# Patient Record
Sex: Male | Born: 1965 | Race: Black or African American | Hispanic: No | Marital: Married | Smoking: Never smoker
Health system: Southern US, Community
[De-identification: ages and names within clinical notes are randomized; demographics above are authoritative.]

## PROBLEM LIST (undated history)

## (undated) HISTORY — PX: OTHER SURGICAL HISTORY: SHX169

## (undated) HISTORY — PX: KNEE ARTHROSCOPY: SUR90

---

## 2003-11-09 ENCOUNTER — Emergency Department: Payer: Self-pay | Admitting: Internal Medicine

## 2013-12-26 ENCOUNTER — Emergency Department: Payer: Self-pay | Admitting: Emergency Medicine

## 2014-01-09 ENCOUNTER — Emergency Department: Payer: Self-pay | Admitting: Emergency Medicine

## 2014-01-09 LAB — CBC
HCT: 42.3 % (ref 40.0–52.0)
HGB: 13.9 g/dL (ref 13.0–18.0)
MCH: 29.8 pg (ref 26.0–34.0)
MCHC: 32.9 g/dL (ref 32.0–36.0)
MCV: 91 fL (ref 80–100)
Platelet: 218 10*3/uL (ref 150–440)
RBC: 4.68 10*6/uL (ref 4.40–5.90)
RDW: 13.9 % (ref 11.5–14.5)
WBC: 6.1 10*3/uL (ref 3.8–10.6)

## 2014-01-09 LAB — BASIC METABOLIC PANEL
ANION GAP: 6 — AB (ref 7–16)
BUN: 10 mg/dL (ref 7–18)
CHLORIDE: 103 mmol/L (ref 98–107)
Calcium, Total: 7.9 mg/dL — ABNORMAL LOW (ref 8.5–10.1)
Co2: 28 mmol/L (ref 21–32)
Creatinine: 0.92 mg/dL (ref 0.60–1.30)
EGFR (African American): >60
Glucose: 82 mg/dL (ref 65–99)
Osmolality: 272 (ref 275–301)
POTASSIUM: 4.1 mmol/L (ref 3.5–5.1)
Sodium: 137 mmol/L (ref 136–145)

## 2014-01-09 LAB — TROPONIN I: Troponin-I: 0.03 ng/mL

## 2014-04-25 ENCOUNTER — Emergency Department: Payer: Self-pay | Admitting: Internal Medicine

## 2015-04-10 ENCOUNTER — Inpatient Hospital Stay
Admission: EM | Admit: 2015-04-10 | Discharge: 2015-04-18 | DRG: 871 | Disposition: A | Discharge status: Home / Self Care | Payer: No Typology Code available for payment source | Attending: Internal Medicine | Admitting: Internal Medicine

## 2015-04-10 ENCOUNTER — Emergency Department: Payer: No Typology Code available for payment source

## 2015-04-10 DIAGNOSIS — I309 Acute pericarditis, unspecified: Secondary | ICD-10-CM | POA: Diagnosis present

## 2015-04-10 DIAGNOSIS — R197 Diarrhea, unspecified: Secondary | ICD-10-CM | POA: Diagnosis present

## 2015-04-10 DIAGNOSIS — I248 Other forms of acute ischemic heart disease: Secondary | ICD-10-CM | POA: Diagnosis present

## 2015-04-10 DIAGNOSIS — J918 Pleural effusion in other conditions classified elsewhere: Secondary | ICD-10-CM | POA: Diagnosis present

## 2015-04-10 DIAGNOSIS — J9601 Acute respiratory failure with hypoxia: Secondary | ICD-10-CM

## 2015-04-10 DIAGNOSIS — J90 Pleural effusion, not elsewhere classified: Secondary | ICD-10-CM

## 2015-04-10 DIAGNOSIS — J1100 Influenza due to unidentified influenza virus with unspecified type of pneumonia: Secondary | ICD-10-CM | POA: Diagnosis present

## 2015-04-10 DIAGNOSIS — R778 Other specified abnormalities of plasma proteins: Secondary | ICD-10-CM

## 2015-04-10 DIAGNOSIS — A4189 Other specified sepsis: Secondary | ICD-10-CM | POA: Diagnosis present

## 2015-04-10 DIAGNOSIS — R7989 Other specified abnormal findings of blood chemistry: Secondary | ICD-10-CM

## 2015-04-10 DIAGNOSIS — E876 Hypokalemia: Secondary | ICD-10-CM

## 2015-04-10 DIAGNOSIS — M3500 Sicca syndrome, unspecified: Secondary | ICD-10-CM | POA: Diagnosis not present

## 2015-04-10 DIAGNOSIS — I32 Pericarditis in diseases classified elsewhere: Secondary | ICD-10-CM | POA: Diagnosis not present

## 2015-04-10 DIAGNOSIS — J849 Interstitial pulmonary disease, unspecified: Secondary | ICD-10-CM | POA: Diagnosis present

## 2015-04-10 DIAGNOSIS — E871 Hypo-osmolality and hyponatremia: Secondary | ICD-10-CM

## 2015-04-10 DIAGNOSIS — I444 Left anterior fascicular block: Secondary | ICD-10-CM | POA: Diagnosis present

## 2015-04-10 DIAGNOSIS — J189 Pneumonia, unspecified organism: Secondary | ICD-10-CM | POA: Diagnosis present

## 2015-04-10 DIAGNOSIS — R0602 Shortness of breath: Secondary | ICD-10-CM

## 2015-04-10 LAB — TROPONIN I
TROPONIN I: 0.06 ng/mL — AB (ref <0.031)
TROPONIN I: 0.16 ng/mL — AB (ref <0.031)
TROPONIN I: 0.09 ng/mL — AB (ref <0.031)
Troponin I: 0.07 ng/mL — ABNORMAL HIGH (ref <0.031)
Troponin I: 0.1 ng/mL — ABNORMAL HIGH (ref <0.031)

## 2015-04-10 LAB — COMPREHENSIVE METABOLIC PANEL
ALT: 35 U/L (ref 17–63)
AST: 59 U/L — ABNORMAL HIGH (ref 15–41)
Albumin: 3.5 g/dL (ref 3.5–5.0)
Alkaline Phosphatase: 53 U/L (ref 38–126)
Anion gap: 10 (ref 5–15)
BUN: 13 mg/dL (ref 6–20)
CHLORIDE: 98 mmol/L — AB (ref 101–111)
CO2: 25 mmol/L (ref 22–32)
CREATININE: 0.96 mg/dL (ref 0.61–1.24)
Calcium: 8.5 mg/dL — ABNORMAL LOW (ref 8.9–10.3)
GFR calc non Af Amer: >60 mL/min (ref >60)
Glucose, Bld: 119 mg/dL — ABNORMAL HIGH (ref 65–99)
POTASSIUM: 3.7 mmol/L (ref 3.5–5.1)
SODIUM: 133 mmol/L — AB (ref 135–145)
Total Bilirubin: 0.8 mg/dL (ref 0.3–1.2)
Total Protein: 8.1 g/dL (ref 6.5–8.1)

## 2015-04-10 LAB — CBC
HCT: 41.6 % (ref 40.0–52.0)
Hemoglobin: 14.2 g/dL (ref 13.0–18.0)
MCH: 30.5 pg (ref 26.0–34.0)
MCHC: 34.1 g/dL (ref 32.0–36.0)
MCV: 89.4 fL (ref 80.0–100.0)
PLATELETS: 158 10*3/uL (ref 150–440)
RBC: 4.65 MIL/uL (ref 4.40–5.90)
RDW: 13.9 % (ref 11.5–14.5)
WBC: 9.3 10*3/uL (ref 3.8–10.6)

## 2015-04-10 LAB — RAPID INFLUENZA A&B ANTIGENS (ARMC ONLY)
INFLUENZA A (ARMC): NEGATIVE
INFLUENZA B (ARMC): NEGATIVE

## 2015-04-10 LAB — INFLUENZA PANEL BY PCR (TYPE A & B)
H1N1FLUPCR: NOT DETECTED
INFLBPCR: NEGATIVE
Influenza A By PCR: POSITIVE — AB

## 2015-04-10 MED ORDER — SODIUM CHLORIDE 0.9 % IV SOLN
INTRAVENOUS | Status: AC
  Administered 2015-04-10: 10:00:00 via INTRAVENOUS

## 2015-04-10 MED ORDER — ACETAMINOPHEN 325 MG PO TABS
650.0000 mg | ORAL_TABLET | Freq: Four times a day (QID) | ORAL | Status: DC | PRN
  Administered 2015-04-12: 650 mg via ORAL
  Filled 2015-04-10: qty 2

## 2015-04-10 MED ORDER — SODIUM CHLORIDE 0.9 % IV BOLUS (SEPSIS)
1000.0000 mL | Freq: Once | INTRAVENOUS | Status: AC
Start: 2015-04-10 — End: 2015-04-10
  Administered 2015-04-10: 1000 mL via INTRAVENOUS

## 2015-04-10 MED ORDER — LEVOFLOXACIN IN D5W 750 MG/150ML IV SOLN
750.0000 mg | Freq: Once | INTRAVENOUS | Status: AC
  Administered 2015-04-10: 750 mg via INTRAVENOUS
  Filled 2015-04-10: qty 150

## 2015-04-10 MED ORDER — SODIUM CHLORIDE 0.9% FLUSH
3.0000 mL | Freq: Two times a day (BID) | INTRAVENOUS | Status: DC
  Administered 2015-04-10 – 2015-04-18 (×9): 3 mL via INTRAVENOUS

## 2015-04-10 MED ORDER — OSELTAMIVIR PHOSPHATE 75 MG PO CAPS
75.0000 mg | ORAL_CAPSULE | Freq: Two times a day (BID) | ORAL | Status: AC
  Administered 2015-04-10 – 2015-04-14 (×10): 75 mg via ORAL
  Filled 2015-04-10 (×10): qty 1

## 2015-04-10 MED ORDER — SODIUM CHLORIDE 0.9 % IV SOLN: 250.0000 mL | INTRAVENOUS | Status: DC | PRN

## 2015-04-10 MED ORDER — ENOXAPARIN SODIUM 40 MG/0.4ML ~~LOC~~ SOLN
40.0000 mg | SUBCUTANEOUS | Status: DC
  Administered 2015-04-12 – 2015-04-17 (×4): 40 mg via SUBCUTANEOUS
  Filled 2015-04-10 (×8): qty 0.4

## 2015-04-10 MED ORDER — IPRATROPIUM-ALBUTEROL 0.5-2.5 (3) MG/3ML IN SOLN
3.0000 mL | RESPIRATORY_TRACT | Status: DC | PRN
  Administered 2015-04-10: 3 mL via RESPIRATORY_TRACT
  Filled 2015-04-10 (×2): qty 3

## 2015-04-10 MED ORDER — OXYCODONE-ACETAMINOPHEN 5-325 MG PO TABS
1.0000 | ORAL_TABLET | Freq: Four times a day (QID) | ORAL | Status: DC | PRN
  Administered 2015-04-10 – 2015-04-18 (×27): 1 via ORAL
  Filled 2015-04-10 (×28): qty 1

## 2015-04-10 MED ORDER — AZITHROMYCIN 500 MG IV SOLR
500.0000 mg | INTRAVENOUS | Status: DC
  Administered 2015-04-11 – 2015-04-12 (×2): 500 mg via INTRAVENOUS
  Filled 2015-04-10 (×2): qty 500

## 2015-04-10 MED ORDER — DEXTROSE 5 % IV SOLN
1.0000 g | INTRAVENOUS | Status: AC
  Administered 2015-04-10 – 2015-04-16 (×7): 1 g via INTRAVENOUS
  Filled 2015-04-10 (×7): qty 10

## 2015-04-10 MED ORDER — ACETAMINOPHEN 500 MG PO TABS
1000.0000 mg | ORAL_TABLET | Freq: Once | ORAL | Status: AC
  Administered 2015-04-10: 1000 mg via ORAL
  Filled 2015-04-10: qty 2

## 2015-04-10 MED ORDER — GUAIFENESIN 100 MG/5ML PO SOLN
5.0000 mL | ORAL | Status: DC | PRN
  Administered 2015-04-17: 100 mg via ORAL
  Filled 2015-04-10: qty 10

## 2015-04-10 MED ORDER — ATORVASTATIN CALCIUM 20 MG PO TABS
40.0000 mg | ORAL_TABLET | Freq: Every day | ORAL | Status: DC
  Administered 2015-04-12 – 2015-04-17 (×3): 40 mg via ORAL
  Filled 2015-04-10 (×4): qty 2

## 2015-04-10 MED ORDER — SODIUM CHLORIDE 0.9% FLUSH: 3.0000 mL | INTRAVENOUS | Status: DC | PRN

## 2015-04-10 MED ORDER — ASPIRIN EC 325 MG PO TBEC
325.0000 mg | DELAYED_RELEASE_TABLET | Freq: Every day | ORAL | Status: DC
  Administered 2015-04-10 – 2015-04-18 (×9): 325 mg via ORAL
  Filled 2015-04-10 (×9): qty 1

## 2015-04-10 MED ORDER — OXYCODONE-ACETAMINOPHEN 5-325 MG PO TABS
1.0000 | ORAL_TABLET | Freq: Once | ORAL | Status: AC
  Administered 2015-04-10: 1 via ORAL
  Filled 2015-04-10: qty 1

## 2015-04-10 MED ORDER — IBUPROFEN 400 MG PO TABS
400.0000 mg | ORAL_TABLET | Freq: Four times a day (QID) | ORAL | Status: DC | PRN
  Administered 2015-04-10 – 2015-04-13 (×6): 400 mg via ORAL
  Filled 2015-04-10 (×6): qty 1

## 2015-04-10 NOTE — Progress Notes (Signed)
Pts Influenza A by  PCR , positive. MD Imogene Burnhen notified. MD placing orders.

## 2015-04-10 NOTE — ED Provider Notes (Signed)
[facility] Emergency Department Provider Note  Time seen: 6:35 AM  I have reviewed the triage vital signs and the nursing notes.   HISTORY  Chief Complaint Generalized Body Aches; Diarrhea; and Fever    HPI Ethan Warner is a 50 y.o. male with no past medical history who presents the emergency department with several days of bodyaches fever, nausea, vomiting, diarrhea, cough congestion and decreased urination. According to the patient for the past 3 days or so he has been coughing, at first with body aches, nausea, vomiting, diarrhea although he states the vomiting and diarrhea have largely resolved at this point. Patient also with fever to 103 in the emergency department. Of note the patient states he has not been eating or drinking much but also states he has not urinated in approximately 2 days which is very abnormal for him. Denies any abdominal pain.Does state mild chest discomfort when coughing.     History reviewed. No pertinent past medical history.  There are no active problems to display for this patient.   History reviewed. No pertinent past surgical history.  No current outpatient prescriptions on file.  Allergies Review of patient's allergies indicates no known allergies.  No family history on file.  Social History Social History  Substance Use Topics  . Smoking status: Never Smoker   . Smokeless tobacco: None  . Alcohol Use: Yes    Review of Systems Constitutional: Negative for fever. Cardiovascular: Mild chest pain when coughing Respiratory: Negative for shortness of breath. Positive for cough. Gastrointestinal: Negative for abdominal pain. Positive for nausea, vomiting, diarrhea although patient states these have largely resolved. Genitourinary: Decreased urination Musculoskeletal: Negative for back pain. Neurological: Negative for headache  10-point ROS otherwise  negative.  ____________________________________________   PHYSICAL EXAM:  VITAL SIGNS: ED Triage Vitals  Enc Vitals Group     BP 04/10/15 0624 117/69 mmHg     Pulse Rate 04/10/15 0624 104     Resp 04/10/15 0624 20     Temp 04/10/15 0624 103 F (39.4 C)     Temp Source 04/10/15 0624 Oral     SpO2 04/10/15 0624 95 %     Weight 04/10/15 0624 198 lb (89.812 kg)     Height 04/10/15 0624  (1.753 m)     Head Cir --      Peak Flow --      Pain Score 04/10/15 0625 10     Pain Loc --      Pain Edu? --      Excl. in GC? --     Constitutional: Alert and oriented. Well appearing and in no distress. Eyes: Normal exam ENT   Head: Normocephalic and atraumatic.   Mouth/Throat: Mucous membranes are moist. Cardiovascular: Normal rate, regular rhythm around 100 bpm. No murmur Respiratory: Normal respiratory effort without tachypnea nor retractions. Breath sounds are clear Gastrointestinal: Soft and nontender. No distention. Musculoskeletal: Nontender with normal range of motion in all extremities.  Neurologic:  Normal speech and language. No gross focal neurologic deficits  Skin:  Skin is warm, dry and intact.  Psychiatric: Mood and affect are normal. Speech and behavior are normal.  ____________________________________________    EKG  EKG reviewed and interpreted by myself shows sinus tachycardia 106 bpm, narrow QRS, normal axis, normal intervals, RSR pattern most consistent with right bundle branch block.  ____________________________________________    RADIOLOGY  Multifocal pneumonia  ____________________________________________   INITIAL IMPRESSION / ASSESSMENT AND PLAN / ED COURSE  Pertinent  labs & imaging results that were available during my care of the patient were reviewed by me and considered in my medical decision making (see chart for details).  Patient with body aches, fever, nausea, vomiting, diarrhea, cough and congestion. States mild chest  discomfort but only when coughing. Patient has a fever 103 in the emergency department. Of note the patient states he has not urinated in approximately 2 days. Denies any medical history, does not take any medications. We will check labs, flu swab, chest x-ray, and IV hydrate while awaiting lab results. Overall the patient appears well, no distress in the emergency department. Patient has a nontender abdomen.  Xray consistent with multifocal pna.  Will admit for further workup.    ____________________________________________   FINAL CLINICAL IMPRESSION(S) / ED DIAGNOSES  Fever Multifocal pneumonia  Minna AntisKevin Alyissa Whidbee, MD 04/10/15 [phone]

## 2015-04-10 NOTE — ED Notes (Signed)
Patient with onset of body aches, fever, nausea, vomiting, diarrhea, coughing, sore throat, chest discomfort. States shortness of breath as well.

## 2015-04-10 NOTE — Progress Notes (Signed)
Pts troponin 0.16. MD Sudini notified. Per MD "he will look at it". No orders received.

## 2015-04-10 NOTE — Progress Notes (Signed)
Pt refusing SCD's and Lovenox. Pt educated. MD Imogene Burnhen notified. No new orders received.

## 2015-04-10 NOTE — H&P (Addendum)
[facility] [place] at [facility]   PATIENT NAME: Ethan Warner Vallandingham    MR#:  [id number]  DATE OF BIRTH:  April 24, 1965  DATE OF ADMISSION:  04/10/2015  PRIMARY CARE PHYSICIAN: Pcp Not In System   REQUESTING/REFERRING PHYSICIAN: Minna AntisKevin Paduchowski, MD  CHIEF COMPLAINT:   Chief Complaint  Patient presents with  . Generalized Body Aches  . Diarrhea  . Fever    To 103.0 at home   fever, chills, cough and diarrhea for 5 days  HISTORY OF PRESENT ILLNESS:  Ethan Warner Cercone  is a 50 y.o. male with no past medical history. The patient has had fever, chills, cough and shortness breath for the past 5 days. He also complains of generalized body aches, nausea, vomiting and diarrhea. Diarrhea is watery without melena or bloody stool. He denies any other symptoms. 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 Chest x-ray show bilateral pneumonia. He was treated with Levaquin in the ED.  PAST MEDICAL HISTORY:  History reviewed. No pertinent past medical history. O past medical history.  PAST SURGICAL HISTORY:   Past Surgical History  Procedure Laterality Date  . Knee arthroscopy    . Hand surgery      SOCIAL HISTORY:   Social History  Substance Use Topics  . Smoking status: Never Smoker   . Smokeless tobacco: Not on file  . Alcohol Use: Yes    FAMILY HISTORY:   Family History  Problem Relation Age of Onset  . Cancer Father     DRUG ALLERGIES:  No Known Allergies  REVIEW OF SYSTEMS:  CONSTITUTIONAL: Has fever, chills and weakness.  EYES: No blurred or double vision.  EARS, NOSE, AND THROAT: No tinnitus or ear pain.  RESPIRATORY: has cough, shortness of breath, no wheezing or hemoptysis.  CARDIOVASCULAR: No chest pain, orthopnea, edema.  GASTROINTESTINAL: has nausea, vomiting, diarrhea but no abdominal pain.  GENITOURINARY: No dysuria, hematuria.  ENDOCRINE: No polyuria, nocturia,  HEMATOLOGY: No anemia, easy bruising or bleeding SKIN: No rash  or lesion. MUSCULOSKELETAL: No joint pain or arthritis.   NEUROLOGIC: No tingling, numbness, weakness.  PSYCHIATRY: No anxiety or depression.   MEDICATIONS AT HOME:   Prior to Admission medications   Not on File      VITAL SIGNS:  Blood pressure 121/72, pulse 100, temperature 101.7 F (38.7 C), temperature source Oral, resp. rate 24, height 5\' 9"  (1.753 m), weight 89.812 kg (198 lb), SpO2 93 %.  PHYSICAL EXAMINATION:  GENERAL:  50 y.o.-year-old patient lying in the bed with no acute distress.  EYES: Pupils equal, round, reactive to light and accommodation. No scleral icterus. Extraocular muscles intact.  HEENT: Head atraumatic, normocephalic. Oropharynx and nasopharynx clear.  NECK:  Supple, no jugular venous distention. No thyroid enlargement, no tenderness.  LUNGS: Normal breath sounds bilaterally, no wheezing, rales,rhonchi or crepitation. No use of accessory muscles of respiration.  CARDIOVASCULAR: S1, S2 normal. No murmurs, rubs, or gallops.  ABDOMEN: Soft, nontender, nondistended. Bowel sounds present. No organomegaly or mass.  EXTREMITIES: No pedal edema, cyanosis, or clubbing.  NEUROLOGIC: Cranial nerves II through XII are intact. Muscle strength 5/5 in all extremities. Sensation intact. Gait not checked.  PSYCHIATRIC: The patient is alert and oriented x 3.  SKIN: No obvious rash, lesion, or ulcer.   LABORATORY PANEL:   CBC  Recent Labs Lab 04/10/15 0635  WBC 9.3  HGB 14.2  HCT 41.6  PLT 158   ------------------------------------------------------------------------------------------------------------------  Chemistries  No results for input(s):  NA, K, CL, CO2, GLUCOSE, BUN, CREATININE, CALCIUM, MG, AST, ALT, ALKPHOS, BILITOT in the last 168 hours.  Invalid input(s): GFRCGP ------------------------------------------------------------------------------------------------------------------  Cardiac Enzymes No results for input(s): TROPONINI in the last 168  hours. ------------------------------------------------------------------------------------------------------------------  RADIOLOGY:  Dg Chest 2 View  04/10/2015  CLINICAL DATA:  Body aches and fever.  Nausea and vomiting. EXAM: CHEST  2 VIEW COMPARISON:  January 09, 2014 FINDINGS: No pneumothorax. Bibasilar opacities are seen, right greater than left. Obscuration of the heart on the lateral view suggests a right middle lobe or lingula component as well. The cardiomediastinal silhouette is stable. A small effusion is seen on the lateral view. No other acute abnormalities. IMPRESSION: Bibasilar opacities. Opacity in the right middle lobe or lingula based on the lateral view. Small effusion. The findings suggest multi focal pneumonia given history. Electronically Signed   By: Gerome Sam III M.D   On: 04/10/2015 07:09    EKG:   Orders placed or performed during the hospital encounter of 04/10/15  . EKG 12-Lead  . EKG 12-Lead    IMPRESSION AND PLAN:   Bilateral pneumonia with sepsis (temperature 103, tachycardia and tachypnea) The patient was treated with Levaquin in the ED. I will start Zithromax and Rocephin, follow-up cultures and CBC. DuoNeb when necessary and Robitussin when necessary. Influenza tests are negative.  Diarrhea. Check stool C diff.  Hyponatremia. NS iv and f/u BMP.  Elevated troponin, f/u troponin, start ASA and lipitor, check lipid panel and cardiology consult.  All the records are reviewed and case discussed with ED provider. Management plans discussed with the patient,   his wife and they are in agreement.  CODE STATUS:  full code  TOTAL TIME TAKING CARE OF THIS PATIENT: 53 minutes.    Shaune Pollack M.D on 04/10/2015 at 8:20 AM  Between 7am to 6pm - Pager - [phone]  After 6pm go to [email or web address] - password [email or web address]  [facility]  Office  [phone]  CC: Primary care physician; Pcp Not In System

## 2015-04-10 NOTE — Progress Notes (Addendum)
Pts temp 102. 2, O2 81%. Pt placed on 2 L of O2. O2 now 94 %.  MD Imogene Burnhen paged. Waiting on call back  Md returned page and notified of the above. Orders received for Ibuprofen 400 mg Q 6PRN.

## 2015-04-10 NOTE — Progress Notes (Signed)
ANTIBIOTIC CONSULT NOTE - INITIAL  Pharmacy Consult for Renal Adjustment of Antibiotics   No Known Allergies  Patient Measurements: Height: 5\' 9"  (175.3 cm) Weight: 198 lb (89.812 kg) IBW/kg (Calculated) : 70.7   Vital Signs: Temp: 98.9 F (37.2 C) (03/11 0904) Temp Source: Oral (03/11 0904) BP: 125/70 mmHg (03/11 0904) Pulse Rate: 97 (03/11 0904) Intake/Output from previous day:   Intake/Output from this shift:    Labs:  Recent Labs  04/10/15 0635  WBC 9.3  HGB 14.2  PLT 158  CREATININE 0.96   Estimated Creatinine Clearance: 102 mL/min (by C-G formula based on Cr of 0.96). No results for input(s): VANCOTROUGH, VANCOPEAK, VANCORANDOM, GENTTROUGH, GENTPEAK, GENTRANDOM, TOBRATROUGH, TOBRAPEAK, TOBRARND, AMIKACINPEAK, AMIKACINTROU, AMIKACIN in the last 72 hours.   Microbiology: Recent Results (from the past 720 hour(s))  Rapid Influenza A&B Antigens ([facility] only)     Status: None   Collection Time: 04/10/15  6:36 AM  Result Value Ref Range Status   Influenza A ([facility]) NEGATIVE NEGATIVE Final   Influenza B ([facility]) NEGATIVE NEGATIVE Final    Medical History: History reviewed. No pertinent past medical history.  Medications:  Scheduled:  . aspirin EC  325 mg Oral Daily  . atorvastatin  40 mg Oral q1800  . [START ON 04/11/2015] azithromycin  500 mg Intravenous Q24H  . cefTRIAXone (ROCEPHIN)  IV  1 g Intravenous Q24H  . enoxaparin (LOVENOX) injection  40 mg Subcutaneous Q24H  . sodium chloride flush  3 mL Intravenous Q12H   Infusions:  . sodium chloride 75 mL/hr at 04/10/15 1028   Assessment: Pharmacy consulted to review meds in a 50 yo male with pneumonia.  Patient currently ordered Azithromycin 500 mg IV q24h and Ceftriaxone 1 gm IV q24h.   Est CrCl~102 mL/min  Plan:  No renal adjustments warranted at this time.   Pharmacy will continue to follow.   Terrie Haring G 04/10/2015,10:33 AM

## 2015-04-10 NOTE — Progress Notes (Signed)
Patient complaint of chest pain, prn pain med administered (see MAR). Lungs wheezing prn nebulizer administered. MD Oralia Manisavid Willis notified of positive troponin from previous shift see new orders.  Placed on tele. Will cont to monitor.

## 2015-04-10 NOTE — Progress Notes (Addendum)
Pt c/o headache 10/10. MD Imogene Burnhen notified, orders received for Q6 PRN Percocet (see MAR).

## 2015-04-10 NOTE — Progress Notes (Addendum)
Lab called to notify pts troponin of 0.07, MD Imogene Burnhen paged. Awaiting on call back  Call back received, MD Imogene Burnhen notified, no new orders received.

## 2015-04-11 LAB — CBC
HCT: 37 % — ABNORMAL LOW (ref 40.0–52.0)
Hemoglobin: 12.5 g/dL — ABNORMAL LOW (ref 13.0–18.0)
MCH: 30.7 pg (ref 26.0–34.0)
MCHC: 33.9 g/dL (ref 32.0–36.0)
MCV: 90.6 fL (ref 80.0–100.0)
PLATELETS: 138 10*3/uL — AB (ref 150–440)
RBC: 4.08 MIL/uL — ABNORMAL LOW (ref 4.40–5.90)
RDW: 13.8 % (ref 11.5–14.5)
WBC: 8.2 10*3/uL (ref 3.8–10.6)

## 2015-04-11 LAB — BASIC METABOLIC PANEL
ANION GAP: 8 (ref 5–15)
BUN: 13 mg/dL (ref 6–20)
CHLORIDE: 99 mmol/L — AB (ref 101–111)
CO2: 26 mmol/L (ref 22–32)
Calcium: 7.6 mg/dL — ABNORMAL LOW (ref 8.9–10.3)
Creatinine, Ser: 0.99 mg/dL (ref 0.61–1.24)
GFR calc Af Amer: >60 mL/min (ref >60)
Glucose, Bld: 108 mg/dL — ABNORMAL HIGH (ref 65–99)
POTASSIUM: 3.4 mmol/L — AB (ref 3.5–5.1)
SODIUM: 133 mmol/L — AB (ref 135–145)

## 2015-04-11 LAB — TROPONIN I
TROPONIN I: 0.09 ng/mL — AB (ref <0.031)
Troponin I: 0.08 ng/mL — ABNORMAL HIGH (ref <0.031)

## 2015-04-11 LAB — LIPID PANEL
Cholesterol: 124 mg/dL (ref 0–200)
HDL: 19 mg/dL — AB (ref >40)
LDL Cholesterol: 78 mg/dL (ref 0–99)
TRIGLYCERIDES: 135 mg/dL (ref <150)
Total CHOL/HDL Ratio: 6.5 RATIO
VLDL: 27 mg/dL (ref 0–40)

## 2015-04-11 LAB — STREP PNEUMONIAE URINARY ANTIGEN: STREP PNEUMO URINARY ANTIGEN: NEGATIVE

## 2015-04-11 LAB — HIV ANTIBODY (ROUTINE TESTING W REFLEX): HIV Screen 4th Generation wRfx: NONREACTIVE

## 2015-04-11 LAB — MAGNESIUM: MAGNESIUM: 1.9 mg/dL (ref 1.7–2.4)

## 2015-04-11 MED ORDER — POTASSIUM CHLORIDE CRYS ER 20 MEQ PO TBCR
40.0000 meq | EXTENDED_RELEASE_TABLET | Freq: Once | ORAL | Status: AC
  Administered 2015-04-11: 40 meq via ORAL
  Filled 2015-04-11: qty 2

## 2015-04-11 MED ORDER — ONDANSETRON HCL 4 MG/2ML IJ SOLN
4.0000 mg | INTRAMUSCULAR | Status: DC | PRN
  Administered 2015-04-11: 4 mg via INTRAVENOUS
  Filled 2015-04-11: qty 2

## 2015-04-11 NOTE — Progress Notes (Signed)
ANTIBIOTIC CONSULT NOTE - Follow Up  Pharmacy Consult for Renal Adjustment of Antibiotics   No Known Allergies  Patient Measurements: Height: 5\' 9"  (175.3 cm) Weight: 198 lb (89.812 kg) IBW/kg (Calculated) : 70.7   Vital Signs: Temp: 100 F (37.8 C) (03/12 1314) Temp Source: Axillary (03/12 1314) BP: 110/62 mmHg (03/12 1109) Pulse Rate: 91 (03/12 0734) Intake/Output from previous day: 03/11 0701 - 03/12 0700 In: 1845 [P.O.:480; I.V.:1315; IV Piggyback:50] Out: 0  Intake/Output from this shift:    Labs:  Recent Labs  04/10/15 0635 04/11/15 0420  WBC 9.3 8.2  HGB 14.2 12.5*  PLT 158 138*  CREATININE 0.96 0.99   Estimated Creatinine Clearance: 98.9 mL/min (by C-G formula based on Cr of 0.99). No results for input(s): VANCOTROUGH, VANCOPEAK, VANCORANDOM, GENTTROUGH, GENTPEAK, GENTRANDOM, TOBRATROUGH, TOBRAPEAK, TOBRARND, AMIKACINPEAK, AMIKACINTROU, AMIKACIN in the last 72 hours.   Microbiology: Recent Results (from the past 720 hour(s))  Rapid Influenza A&B Antigens ([facility] only)     Status: None   Collection Time: 04/10/15  6:36 AM  Result Value Ref Range Status   Influenza A ([facility]) NEGATIVE NEGATIVE Final   Influenza B ([facility]) NEGATIVE NEGATIVE Final  Blood culture (routine x 2)     Status: None (Preliminary result)   Collection Time: 04/10/15  7:31 AM  Result Value Ref Range Status   Specimen Description BLOOD RIGHT FOREARM  Final   Special Requests BOTTLES DRAWN AEROBIC AND ANAEROBIC 1CCAERO,1CCANA  Final   Culture NO GROWTH 1 DAY  Final   Report Status PENDING  Incomplete  Blood culture (routine x 2)     Status: None (Preliminary result)   Collection Time: 04/10/15  7:57 AM  Result Value Ref Range Status   Specimen Description BLOOD LEFT FOREARM  Final   Special Requests BOTTLES DRAWN AEROBIC AND ANAEROBIC 1CCAERO,1CCANA  Final   Culture NO GROWTH 1 DAY  Final   Report Status PENDING  Incomplete    Medical History: History reviewed. No pertinent past  medical history.  Medications:  Scheduled:  . aspirin EC  325 mg Oral Daily  . atorvastatin  40 mg Oral q1800  . azithromycin  500 mg Intravenous Q24H  . cefTRIAXone (ROCEPHIN)  IV  1 g Intravenous Q24H  . enoxaparin (LOVENOX) injection  40 mg Subcutaneous Q24H  . oseltamivir  75 mg Oral BID  . sodium chloride flush  3 mL Intravenous Q12H   Infusions:    Assessment: Pharmacy consulted to review meds in a 50 yo male with pneumonia.  Patient currently ordered Azithromycin 500 mg IV q24h and Ceftriaxone 1 gm IV q24h.   Est CrCl~98.9 mL/min  Plan:  No renal adjustments warranted at this time.   Pharmacy will continue to follow.   Terik Haughey G 04/11/2015,2:58 PM

## 2015-04-11 NOTE — Progress Notes (Signed)
MD Oralia Manisavid Willis notified recent troponin levels .09 and .1 . Running tachypnic from 24-30.Patient is resting queitly and states he is feeling much better. Will cont to monitor tele and give neb treatments as needed.

## 2015-04-11 NOTE — Consult Note (Signed)
[facility]  Patient ID: Ethan Warner J Crossett, MRN: [id number], DOB/AGE: 07-03-1965 50 y.o. Admit date: 04/10/2015   Date of Consult: 04/11/2015 Primary Physician: Pcp Not In System Primary Cardiologist: None  Chief Complaint:  Chief Complaint  Patient presents with  . Generalized Body Aches  . Diarrhea  . Fever    To 103.0 at home   Reason for Consult: HPI    Fever   Additional comments: To 103.0 at home     Last edited by Darron Doomynthia F Furgurson, RN on 04/10/2015  6:22 AM. (History)     elevated troponin with abnormal EKG  HPI: 50 y.o. male with the no evidence of significant cardiovascular history having acute onset of weakness fatigue shortness of breath and substernal chest discomfort. This is occurred waxing and waning with and without physical activity over the last 4 days for which the patient has had a cough and congestion as well as some fever. The patient has not had any purulent sputum although is having some difficulty with breathing. The patient has had a chest x-ray consistent with bibasilar opacities consistent with pneumonia and has had an EKG showing normal sinus rhythm with left anterior fascicular block. Troponin is 0.16 which is concerning although appears to be more consistent with infection rather than acute coronary syndrome. The patient has felt much better with antibiotics at this time and inhalers and any other treatment  History reviewed. No pertinent past medical history.    Surgical History:  Past Surgical History  Procedure Laterality Date  . Knee arthroscopy    . Hand surgery       Home Meds: Prior to Admission medications   Not on File    Inpatient Medications:  . aspirin EC  325 mg Oral Daily  . atorvastatin  40 mg Oral q1800  . azithromycin  500 mg Intravenous Q24H  . cefTRIAXone (ROCEPHIN)  IV  1 g Intravenous Q24H  . enoxaparin (LOVENOX) injection  40 mg Subcutaneous Q24H  . oseltamivir  75 mg Oral BID  . sodium  chloride flush  3 mL Intravenous Q12H   . sodium chloride 75 mL/hr at 04/10/15 1028    Allergies: No Known Allergies  Social History   Social History  . Marital Status: Married    Spouse Name: N/A  . Number of Children: N/A  . Years of Education: N/A   Occupational History  . Not on file.   Social History Main Topics  . Smoking status: Never Smoker   . Smokeless tobacco: Not on file  . Alcohol Use: Yes  . Drug Use: Not on file  . Sexual Activity: Not on file   Other Topics Concern  . Not on file   Social History Narrative  . No narrative on file     Family History  Problem Relation Age of Onset  . Cancer Father      Review of Systems Positive for cough congestion Negative for: General:  chills, fever, night sweats or weight changes.  Cardiovascular: PND orthopnea syncope dizziness  Dermatological skin lesions rashes Respiratory: Positive for Cough congestion Urologic: Frequent urination urination at night and hematuria Abdominal: negative for nausea, vomiting, diarrhea, bright red blood per rectum, melena, or hematemesis Neurologic: negative for visual changes, and/or hearing changes  All other systems reviewed and are otherwise negative except as noted above.  Labs:  Recent Labs  04/10/15 1615 04/10/15 2140 04/10/15 2304 04/11/15 0420  TROPONINI 0.16* 0.09* 0.10* 0.08*   Lab Results  Component Value Date   WBC 8.2 04/11/2015   HGB 12.5* 04/11/2015   HCT 37.0* 04/11/2015   MCV 90.6 04/11/2015   PLT 138* 04/11/2015    Recent Labs Lab 04/10/15 0635 04/11/15 0420  NA 133* 133*  K 3.7 3.4*  CL 98* 99*  CO2 25 26  BUN 13 13  CREATININE 0.96 0.99  CALCIUM 8.5* 7.6*  PROT 8.1  --   BILITOT 0.8  --   ALKPHOS 53  --   ALT 35  --   AST 59*  --   GLUCOSE 119* 108*   Lab Results  Component Value Date   CHOL 124 04/11/2015   HDL 19* 04/11/2015   LDLCALC 78 04/11/2015   TRIG 135 04/11/2015   No results found for:  DDIMER  Radiology/Studies:  Dg Chest 2 View  04/10/2015  CLINICAL DATA:  Body aches and fever.  Nausea and vomiting. EXAM: CHEST  2 VIEW COMPARISON:  January 09, 2014 FINDINGS: No pneumothorax. Bibasilar opacities are seen, right greater than left. Obscuration of the heart on the lateral view suggests a right middle lobe or lingula component as well. The cardiomediastinal silhouette is stable. A small effusion is seen on the lateral view. No other acute abnormalities. IMPRESSION: Bibasilar opacities. Opacity in the right middle lobe or lingula based on the lateral view. Small effusion. The findings suggest multi focal pneumonia given history. Electronically Signed   By: Gerome Sam III M.D   On: 04/10/2015 07:09    EKG: Normal sinus rhythm left anterior fascicular block  Weights: Filed Weights   04/10/15 0624  Weight: 198 lb (89.812 kg)     Physical Exam: Blood pressure 101/58, pulse 94, temperature 98.5 F (36.9 C), temperature source Oral, resp. rate 22, height  (1.753 m), weight 198 lb (89.812 kg), SpO2 96 %. Body mass index is 29.23 kg/(m^2). General: Well developed, well nourished, in no acute distress. Head eyes ears nose throat: Normocephalic, atraumatic, sclera non-icteric, no xanthomas, nares are without discharge. No apparent thyromegaly and/or mass  Lungs: Normal respiratory effort.  Some wheezes, few basilar rales, diffuse rhonchi.  Heart: RRR with normal S1 split S2. no murmur gallop, no rub, PMI is normal size and placement, carotid upstroke normal without bruit, jugular venous pressure is normal Abdomen: Soft, non-tender, non-distended with normoactive bowel sounds. No hepatomegaly. No rebound/guarding. No obvious abdominal masses. Abdominal aorta is normal size without bruit Extremities: No edema. no cyanosis, no clubbing, no ulcers  Peripheral : 2+ bilateral upper extremity pulses, 2+ bilateral femoral pulses, 2+ bilateral dorsal pedal pulse Neuro: Alert and  oriented. No facial asymmetry. No focal deficit. Moves all extremities spontaneously. Musculoskeletal: Normal muscle tone without kyphosis Psych:  Responds to questions appropriately with a normal affect.    Assessment: 50 year old male with no evidence of previous cardiovascular history having pneumonia and pneumonia-type symptoms with abnormal EKG and elevated troponin consistent with demand ischemia rather than acute coronary syndrome  Plan: 1. Continue treatment of pneumonia and/or infection likely causing current symptoms 2. Procardia gram for LV systolic dysfunction valvular heart disease contributing to above 3. Begin ambulation and follow for any worsening symptoms or concerns for cardiovascular events 4. High intensity cholesterol therapy with atorvastatin 5. Further diagnostic testing and treatment options after above  Signed, Lamar Blinks M.D. [facility] Card[facility] 04/11/2015, 6:31 AM

## 2015-04-11 NOTE — Progress Notes (Signed)
Pt vomiting this am, MD at the nurses station, Zofran requested.Orders received for  Q4 PRN Zofran.

## 2015-04-11 NOTE — Progress Notes (Signed)
[facility] - [place] at [facility]   PATIENT NAME: Ethan Warner    MR#:  [id number]  DATE OF BIRTH:  02-08-1965  SUBJECTIVE:  CHIEF COMPLAINT:   Chief Complaint  Patient presents with  . Generalized Body Aches  . Diarrhea  . Fever    To 103.0 at home   Still has cough, SOB, nausea and vomiting, no diarrhea since admission, has chest pain while coughing. REVIEW OF SYSTEMS:  CONSTITUTIONAL: has fever and generalized weakness.  EYES: No blurred or double vision.  EARS, NOSE, AND THROAT: No tinnitus or ear pain.  RESPIRATORY: Has cough, shortness of breath, no wheezing or hemoptysis.  CARDIOVASCULAR: has chest pain while coughing, no orthopnea, edema.  GASTROINTESTINAL: Has nausea, vomiting, no diarrhea or abdominal pain.  GENITOURINARY: No dysuria, hematuria.  ENDOCRINE: No polyuria, nocturia,  HEMATOLOGY: No anemia, easy bruising or bleeding SKIN: No rash or lesion. MUSCULOSKELETAL: No joint pain or arthritis.   NEUROLOGIC: No tingling, numbness, weakness.  PSYCHIATRY: No anxiety or depression.   DRUG ALLERGIES:  No Known Allergies  VITALS:  Blood pressure 112/58, pulse 91, temperature 99.1 F (37.3 C), temperature source Oral, resp. rate 20, height 5\' 9"  (1.753 m), weight 89.812 kg (198 lb), SpO2 93 %.  PHYSICAL EXAMINATION:  GENERAL:  50 y.o.-year-old patient lying in the bed with no acute distress.  EYES: Pupils equal, round, reactive to light and accommodation. No scleral icterus. Extraocular muscles intact.  HEENT: Head atraumatic, normocephalic. Oropharynx and nasopharynx clear.  NECK:  Supple, no jugular venous distention. No thyroid enlargement, no tenderness.  LUNGS: Normal breath sounds bilaterally, no wheezing, but has mild bilateral crackles. No use of accessory muscles of respiration.  CARDIOVASCULAR: S1, S2 normal. No murmurs, rubs, or gallops.  ABDOMEN: Soft, nontender, nondistended. Bowel sounds present. No organomegaly or mass.   EXTREMITIES: No pedal edema, cyanosis, or clubbing.  NEUROLOGIC: Cranial nerves II through XII are intact. Muscle strength 5/5 in all extremities. Sensation intact. Gait not checked.  PSYCHIATRIC: The patient is alert and oriented x 3.  SKIN: No obvious rash, lesion, or ulcer.    LABORATORY PANEL:   CBC  Recent Labs Lab 04/11/15 0420  WBC 8.2  HGB 12.5*  HCT 37.0*  PLT 138*   ------------------------------------------------------------------------------------------------------------------  Chemistries   Recent Labs Lab 04/10/15 0635 04/11/15 0420  NA 133* 133*  K 3.7 3.4*  CL 98* 99*  CO2 25 26  GLUCOSE 119* 108*  BUN 13 13  CREATININE 0.96 0.99  CALCIUM 8.5* 7.6*  MG  --  1.9  AST 59*  --   ALT 35  --   ALKPHOS 53  --   BILITOT 0.8  --    ------------------------------------------------------------------------------------------------------------------  Cardiac Enzymes  Recent Labs Lab 04/11/15 0420  TROPONINI 0.08*   ------------------------------------------------------------------------------------------------------------------  RADIOLOGY:  Dg Chest 2 View  04/10/2015  CLINICAL DATA:  Body aches and fever.  Nausea and vomiting. EXAM: CHEST  2 VIEW COMPARISON:  January 09, 2014 FINDINGS: No pneumothorax. Bibasilar opacities are seen, right greater than left. Obscuration of the heart on the lateral view suggests a right middle lobe or lingula component as well. The cardiomediastinal silhouette is stable. A small effusion is seen on the lateral view. No other acute abnormalities. IMPRESSION: Bibasilar opacities. Opacity in the right middle lobe or lingula based on the lateral view. Small effusion. The findings suggest multi focal pneumonia given history. Electronically Signed   By: Gerome Samavid  Williams III M.D   On: 04/10/2015 07:09  EKG:   Orders placed or performed during the hospital encounter of 04/10/15  . EKG 12-Lead  . EKG 12-Lead    ASSESSMENT  AND PLAN:   Bilateral pneumonia with sepsis (temperature 103, tachycardia and tachypnea) The patient was treated with Levaquin in the ED. continue Zithromax and Rocephin, follow-up cultures and CBC is normal. DuoNeb when necessary and Robitussin when necessary.  Acute respiratory failure with hypoxia. SAT decreased to 80s yesterday, he was put on oxygen by nasal cannula. Nebulizer when necessary, try to wean off oxygen.  Diarrhea. Resolved, no bowel movement since admission.  Hyponatremia. NS iv and f/u BMP. Hypokalemia. Potassium supplement.  Elevated troponin, possible due to demanding ischemia. Continue ASA and lipitor.  Influenza A. Started tamiflu. Influenza A is positive per PCR test.   All the records are reviewed and case discussed with Care Management/Social Workerr. Management plans discussed with the patient, his wife and they are in agreement.  CODE STATUS: Full code  TOTAL TIME TAKING CARE OF THIS PATIENT: 37 minutes.  Greater than 50% time was spent on coordination of care and face-to-face counseling.  POSSIBLE D/C IN 2-3 DAYS, DEPENDING ON CLINICAL CONDITION.   Shaune Pollack M.D on 04/11/2015 at 11:02 AM  Between 7am to 6pm - Pager - [phone]  After 6pm go to [email or web address] - password [email or web address]  [facility]  Office  [phone]  CC: Primary care physician; Pcp Not In System

## 2015-04-11 NOTE — Progress Notes (Signed)
Pts troponin 0.09, MD Chen notified. No new orders.

## 2015-04-11 NOTE — Progress Notes (Signed)
Pts temp 101.5, Pts O2 89 % on 1 L. Pt now on 2 L 93%. PRN Motrin given.. MD Imogene Burnhen notified. No new orders

## 2015-04-11 NOTE — Progress Notes (Signed)
No loose stools since admission, Per MD Imogene Burnhen ok to d/c enteric precautions.

## 2015-04-12 ENCOUNTER — Inpatient Hospital Stay: Payer: No Typology Code available for payment source

## 2015-04-12 MED ORDER — AZITHROMYCIN 250 MG PO TABS
500.0000 mg | ORAL_TABLET | Freq: Every day | ORAL | Status: DC
  Administered 2015-04-13 – 2015-04-16 (×4): 500 mg via ORAL
  Filled 2015-04-12 (×4): qty 2

## 2015-04-12 NOTE — Progress Notes (Signed)
Called Dr Imogene Burnhen  Regarding patients worsening pneumonia per today's CXR result. patient status acknowledged.

## 2015-04-12 NOTE — Care Management Note (Signed)
Case Management Note  Patient Details  Name: Ethan Warner MRN: [id number] Date of Birth: [birth year]  Subjective/Objective:         49yo Mr Ethan Warner was admitted 04/10/15 with the flu, bilateral PNA, sepsis. He cannot remember the name of his PCP. Pharmacy=[facility] on [address] He resides at home with his wife who can provide transportation if needed. He reports home equipment of Warner BSC, RW, and Warner cane in the home that he can use. He uses no home oxygen, no home health services. Do not anticipate home health after discharge. However, Case management will follow for discharge planning.            Action/Plan:   Expected Discharge Date:                  Expected Discharge Plan:     In-House Referral:     Discharge planning Services     Post Acute Care Choice:    Choice offered to:     DME Arranged:    DME Agency:     HH Arranged:    HH Agency:     Status of Service:     Medicare Important Message Given:    Date Medicare IM Given:    Medicare IM give by:    Date Additional Medicare IM Given:    Additional Medicare Important Message give by:     If discussed at Long Length of Stay Meetings, dates discussed:    Additional Comments:  Ethan Vanpelt A, RN 04/12/2015, 3:59 PM

## 2015-04-12 NOTE — Progress Notes (Signed)
[facility] Phys[facility] - [place] at [facility]   PATIENT NAME: Ethan Warner    MR#:  [id number]  DATE OF BIRTH:  1965-05-29  SUBJECTIVE:  CHIEF COMPLAINT:   Chief Complaint  Patient presents with  . Generalized Body Aches  . Diarrhea  . Fever    To 103.0 at home   Has cough and better SOB, no nausea and vomiting, no diarrhea since admission, has chest pain while coughing. Hypoxia at 84% without O2 [place]. On O2 [place] 2L. REVIEW OF SYSTEMS:  CONSTITUTIONAL: had fever and generalized weakness.  EYES: No blurred or double vision.  EARS, NOSE, AND THROAT: No tinnitus or ear pain.  RESPIRATORY: Has cough, shortness of breath, no wheezing or hemoptysis.  CARDIOVASCULAR: has chest pain while coughing, no orthopnea, edema.  GASTROINTESTINAL: Has nausea, vomiting, no diarrhea or abdominal pain.  GENITOURINARY: No dysuria, hematuria.  ENDOCRINE: No polyuria, nocturia,  HEMATOLOGY: No anemia, easy bruising or bleeding SKIN: No rash or lesion. MUSCULOSKELETAL: No joint pain or arthritis.   NEUROLOGIC: No tingling, numbness, weakness.  PSYCHIATRY: No anxiety or depression.   DRUG ALLERGIES:  No Known Allergies  VITALS:  Blood pressure 98/64, pulse 106, temperature 99.9 F (37.7 C), temperature source Oral, resp. rate 22, height  (1.753 m), weight 89.812 kg (198 lb), SpO2 94 %.  PHYSICAL EXAMINATION:  GENERAL:  50 y.o.-year-old patient lying in the bed with no acute distress.  EYES: Pupils equal, round, reactive to light and accommodation. No scleral icterus. Extraocular muscles intact.  HEENT: Head atraumatic, normocephalic. Oropharynx and nasopharynx clear.  NECK:  Supple, no jugular venous distention. No thyroid enlargement, no tenderness.  LUNGS: Normal breath sounds bilaterally, no wheezing, but has mild bilateral crackles. No use of accessory muscles of respiration.  CARDIOVASCULAR: S1, S2 normal. No murmurs, rubs, or gallops.  ABDOMEN: Soft, nontender,  nondistended. Bowel sounds present. No organomegaly or mass.  EXTREMITIES: No pedal edema, cyanosis, or clubbing.  NEUROLOGIC: Cranial nerves II through XII are intact. Muscle strength 5/5 in all extremities. Sensation intact. Gait not checked.  PSYCHIATRIC: The patient is alert and oriented x 3.  SKIN: No obvious rash, lesion, or ulcer.    LABORATORY PANEL:   CBC  Recent Labs Lab 04/11/15 0420  WBC 8.2  HGB 12.5*  HCT 37.0*  PLT 138*   ------------------------------------------------------------------------------------------------------------------  Chemistries   Recent Labs Lab 04/10/15 0635 04/11/15 0420  NA 133* 133*  K 3.7 3.4*  CL 98* 99*  CO2 25 26  GLUCOSE 119* 108*  BUN 13 13  CREATININE 0.96 0.99  CALCIUM 8.5* 7.6*  MG  --  1.9  AST 59*  --   ALT 35  --   ALKPHOS 53  --   BILITOT 0.8  --    ------------------------------------------------------------------------------------------------------------------  Cardiac Enzymes  Recent Labs Lab 04/11/15 1035  TROPONINI 0.09*   ------------------------------------------------------------------------------------------------------------------  RADIOLOGY:  Dg Chest 2 View  04/12/2015  CLINICAL DATA:  Fever and cough EXAM: CHEST  2 VIEW COMPARISON:  04/10/2015 FINDINGS: Cardiac shadow is stable. Increased bibasilar infiltrates are noted with increasing right-sided pleural effusion. No bony abnormality is seen. IMPRESSION: Increasing bibasilar infiltrates. Electronically Signed   By: Alcide Clever M.D.   On: 04/12/2015 12:18    EKG:   Orders placed or performed during the hospital encounter of 04/10/15  . EKG 12-Lead  . EKG 12-Lead    ASSESSMENT AND PLAN:   Bilateral pneumonia with sepsis The patient was treated with Levaquin in the ED. continue  Zithromax and Rocephin, negative blood cultures and normal CBC so far.  DuoNeb when necessary and Robitussin when necessary. CXR: increased infiltrate  bilaterally.  Acute respiratory failure with hypoxia.  Try to wean off oxygen by nasal cannula. Nebulizer when necessary.  Diarrhea. Resolved, no bowel movement since admission.  Hyponatremia. NS iv and f/u BMP. Hypokalemia. Potassium supplement.  Elevated troponin, possible due to demanding ischemia. Continue ASA and lipitor.  Influenza A. Started tamiflu. Influenza A is positive per PCR test.   All the records are reviewed and case discussed with Care Management/Social Workerr. Management plans discussed with the patient, his wife and they are in agreement.  CODE STATUS: Full code  TOTAL TIME TAKING CARE OF THIS PATIENT: 38 minutes.  Greater than 50% time was spent on coordination of care and face-to-face counseling.  POSSIBLE D/C IN 2-3 DAYS, DEPENDING ON CLINICAL CONDITION.   Shaune Pollackhen, Deontrae Drinkard M.D on 04/12/2015 at 1:06 PM  Between 7am to 6pm - Pager - [phone]  After 6pm go to [email or web address] - password [email or web address]  [facility] [place] [facility]  Office  [phone]  CC: Primary care physician; Pcp Not In System

## 2015-04-13 LAB — BASIC METABOLIC PANEL
Anion gap: 9 (ref 5–15)
BUN: 13 mg/dL (ref 6–20)
CO2: 27 mmol/L (ref 22–32)
CREATININE: 1.01 mg/dL (ref 0.61–1.24)
Calcium: 8.1 mg/dL — ABNORMAL LOW (ref 8.9–10.3)
Chloride: 99 mmol/L — ABNORMAL LOW (ref 101–111)
Glucose, Bld: 137 mg/dL — ABNORMAL HIGH (ref 65–99)
POTASSIUM: 3.9 mmol/L (ref 3.5–5.1)
SODIUM: 135 mmol/L (ref 135–145)

## 2015-04-13 NOTE — Progress Notes (Signed)
Patient complained of the service he received during the night. He states there were delays in his pain medication. We discussed that his pain meds were PRN (as needed) and that he needed to let staff know when he was in pain and needed his medication. Patient verbalized understanding. Will follow up with nursing staff. My business card was given and advised that if he had any other concerns to let me know.

## 2015-04-13 NOTE — Care Management Important Message (Signed)
Important Message  Patient Details  Name: Ethan Warner J Buchanan MRN: [id number] Date of Birth: 1965-03-11   Medicare Important Message Given:  Yes    Yesica Kemler A, RN 04/13/2015, 9:13 AM

## 2015-04-13 NOTE — Progress Notes (Signed)
[facility] Phys[facility] - [place] at [facility]   PATIENT NAME: Ethan Warner    MR#:  [id number]  DATE OF BIRTH:  Apr 17, 1965  SUBJECTIVE:  CHIEF COMPLAINT:   Chief Complaint  Patient presents with  . Generalized Body Aches  . Diarrhea  . Fever    To 103.0 at home   Has cough and chest pain while cough and deep breathing. No SOB, no nausea and vomiting, no diarrhea since admission, On O2 [place] 2L. REVIEW OF SYSTEMS:  CONSTITUTIONAL: no fever or weakness.  EYES: No blurred or double vision.  EARS, NOSE, AND THROAT: No tinnitus or ear pain.  RESPIRATORY: Has cough, no shortness of breath, no wheezing or hemoptysis.  CARDIOVASCULAR: has chest pain while coughing, no orthopnea, edema.  GASTROINTESTINAL: no nausea, vomiting, no diarrhea or abdominal pain.  GENITOURINARY: No dysuria, hematuria.  ENDOCRINE: No polyuria, nocturia,  HEMATOLOGY: No anemia, easy bruising or bleeding SKIN: No rash or lesion. MUSCULOSKELETAL: No joint pain or arthritis.   NEUROLOGIC: No tingling, numbness, weakness.  PSYCHIATRY: No anxiety or depression.   DRUG ALLERGIES:  No Known Allergies  VITALS:  Blood pressure 99/58, pulse 81, temperature 98.5 F (36.9 C), temperature source Oral, resp. rate 18, height  (1.753 m), weight 89.812 kg (198 lb), SpO2 96 %.  PHYSICAL EXAMINATION:  GENERAL:  50 y.o.-year-old patient lying in the bed with no acute distress.  EYES: Pupils equal, round, reactive to light and accommodation. No scleral icterus. Extraocular muscles intact.  HEENT: Head atraumatic, normocephalic. Oropharynx and nasopharynx clear.  NECK:  Supple, no jugular venous distention. No thyroid enlargement, no tenderness.  LUNGS: Normal breath sounds bilaterally, no wheezing, but has mild bilateral crackles. No use of accessory muscles of respiration.  CARDIOVASCULAR: S1, S2 normal. No murmurs, rubs, or gallops.  ABDOMEN: Soft, nontender, nondistended. Bowel sounds present. No  organomegaly or mass.  EXTREMITIES: No pedal edema, cyanosis, or clubbing.  NEUROLOGIC: Cranial nerves II through XII are intact. Muscle strength 5/5 in all extremities. Sensation intact. Gait not checked.  PSYCHIATRIC: The patient is alert and oriented x 3.  SKIN: No obvious rash, lesion, or ulcer.    LABORATORY PANEL:   CBC  Recent Labs Lab 04/11/15 0420  WBC 8.2  HGB 12.5*  HCT 37.0*  PLT 138*   ------------------------------------------------------------------------------------------------------------------  Chemistries   Recent Labs Lab 04/10/15 0635 04/11/15 0420 04/13/15 0415  NA 133* 133* 135  K 3.7 3.4* 3.9  CL 98* 99* 99*  CO2 GLUCOSE 119* 108* 137*  BUN CREATININE 0.96 0.99 1.01  CALCIUM 8.5* 7.6* 8.1*  MG  --  1.9  --   AST 59*  --   --   ALT 35  --   --   ALKPHOS 53  --   --   BILITOT 0.8  --   --    ------------------------------------------------------------------------------------------------------------------  Cardiac Enzymes  Recent Labs Lab 04/11/15 1035  TROPONINI 0.09*   ------------------------------------------------------------------------------------------------------------------  RADIOLOGY:  Dg Chest 2 View  04/12/2015  CLINICAL DATA:  Fever and cough EXAM: CHEST  2 VIEW COMPARISON:  04/10/2015 FINDINGS: Cardiac shadow is stable. Increased bibasilar infiltrates are noted with increasing right-sided pleural effusion. No bony abnormality is seen. IMPRESSION: Increasing bibasilar infiltrates. Electronically Signed   By: Alcide Clever M.D.   On: 04/12/2015 12:18    EKG:   Orders placed or performed during the hospital encounter of 04/10/15  . EKG 12-Lead  . EKG 12-Lead  ASSESSMENT AND PLAN:   Bilateral pneumonia with sepsis, possible with pleuritis. The patient was treated with Levaquin in the ED. He has been treated with Zithromax and Rocephin, negative blood cultures and normal CBC so far.  DuoNeb when  necessary and Robitussin when necessary. CXR: increased infiltrate bilaterally. Ibuprofen prn.  Acute respiratory failure with hypoxia.  Try to wean off oxygen by nasal cannula. Nebulizer when necessary.  Diarrhea. Resolved.  Hyponatremia. Improved with NS iv.. Hypokalemia. Improved with Potassium supplement.  Elevated troponin, possible due to demanding ischemia. Continue ASA and lipitor.  Influenza A. Started tamiflu. Influenza A is positive per PCR test.   All the records are reviewed and case discussed with Care Management/Social Workerr. Management plans discussed with the patient, his wife and they are in agreement.  CODE STATUS: Full code  TOTAL TIME TAKING CARE OF THIS PATIENT: 35 minutes.  Greater than 50% time was spent on coordination of care and face-to-face counseling.  POSSIBLE D/C IN 1-2 DAYS, DEPENDING ON CLINICAL CONDITION.   Shaune Pollackhen, Cerrone Debold M.D on 04/13/2015 at 1:20 PM  Between 7am to 6pm - Pager - [phone]  After 6pm go to [email or web address] - password [email or web address]  [facility] [place] [facility]  Office  [phone]  CC: Primary care physician; Pcp Not In System

## 2015-04-14 MED ORDER — ACETAMINOPHEN 325 MG PO TABS
650.0000 mg | ORAL_TABLET | Freq: Four times a day (QID) | ORAL | Status: DC | PRN
  Filled 2015-04-14: qty 2

## 2015-04-14 MED ORDER — IBUPROFEN 400 MG PO TABS
400.0000 mg | ORAL_TABLET | Freq: Four times a day (QID) | ORAL | Status: DC | PRN
  Administered 2015-04-14 – 2015-04-17 (×2): 400 mg via ORAL
  Filled 2015-04-14 (×2): qty 1

## 2015-04-14 NOTE — Progress Notes (Signed)
[facility] - [place] at [facility]   PATIENT NAME: Ethan Warner    MR#:  [id number]  DATE OF BIRTH:  03-19-1965  SUBJECTIVE:  CHIEF COMPLAINT:   Chief Complaint  Patient presents with  . Generalized Body Aches  . Diarrhea  . Fever    To 103.0 at home   Continues to have cough with brown sputum. Pleuritic chest pain is much improved. Feels weak with poor appetite. Mild shortness of breath. Still febrile with MAXIMUM TEMPERATURE of 100.8.  REVIEW OF SYSTEMS:  CONSTITUTIONAL: no fever or weakness.  EYES: No blurred or double vision.  EARS, NOSE, AND THROAT: No tinnitus or ear pain.  RESPIRATORY: Has cough, mild shortness of breath, no wheezing or hemoptysis.  CARDIOVASCULAR: has chest pain while coughing, no orthopnea, edema.  GASTROINTESTINAL: no nausea, vomiting, no diarrhea or abdominal pain.  GENITOURINARY: No dysuria, hematuria.  ENDOCRINE: No polyuria, nocturia,  HEMATOLOGY: No anemia, easy bruising or bleeding SKIN: No rash or lesion. MUSCULOSKELETAL: No joint pain or arthritis.   NEUROLOGIC: No tingling, numbness, weakness.  PSYCHIATRY: No anxiety or depression.   DRUG ALLERGIES:  No Known Allergies  VITALS:  Blood pressure 126/80, pulse 97, temperature 100.8 F (38.2 C), temperature source Oral, resp. rate 22, height  (1.753 m), weight 89.812 kg (198 lb), SpO2 94 %.  PHYSICAL EXAMINATION:  GENERAL:  50 y.o.-year-old patient lying in the bed with no acute distress.  EYES: Pupils equal, round, reactive to light and accommodation. No scleral icterus. Extraocular muscles intact.  HEENT: Head atraumatic, normocephalic. Oropharynx and nasopharynx clear.  NECK:  Supple, no jugular venous distention. No thyroid enlargement, no tenderness.  LUNGS: Normal breath sounds bilaterally, no wheezing, but has mild bilateral crackles. No use of accessory muscles of respiration.  CARDIOVASCULAR: S1, S2 normal. No murmurs, rubs, or gallops.   ABDOMEN: Soft, nontender, nondistended. Bowel sounds present. No organomegaly or mass.  EXTREMITIES: No pedal edema, cyanosis, or clubbing.  NEUROLOGIC: Cranial nerves II through XII are intact. Muscle strength 5/5 in all extremities. Sensation intact. Gait not checked.  PSYCHIATRIC: The patient is alert and oriented x 3.  SKIN: No obvious rash, lesion, or ulcer.    LABORATORY PANEL:   CBC  Recent Labs Lab 04/11/15 0420  WBC 8.2  HGB 12.5*  HCT 37.0*  PLT 138*   ------------------------------------------------------------------------------------------------------------------  Chemistries   Recent Labs Lab 04/10/15 0635 04/11/15 0420 04/13/15 0415  NA 133* 133* 135  K 3.7 3.4* 3.9  CL 98* 99* 99*  CO2 GLUCOSE 119* 108* 137*  BUN CREATININE 0.96 0.99 1.01  CALCIUM 8.5* 7.6* 8.1*  MG  --  1.9  --   AST 59*  --   --   ALT 35  --   --   ALKPHOS 53  --   --   BILITOT 0.8  --   --    ------------------------------------------------------------------------------------------------------------------  Cardiac Enzymes  Recent Labs Lab 04/11/15 1035  TROPONINI 0.09*   ------------------------------------------------------------------------------------------------------------------  RADIOLOGY:  No results found.  EKG:   Orders placed or performed during the hospital encounter of 04/10/15  . EKG 12-Lead  . EKG 12-Lead    ASSESSMENT AND PLAN:   # Bilateral influenza a pneumonitis with sepsis -IV Antibiotics , ceftriaxone and azithromycin. - Tamiflu - Scheduled Nebulizers -Wean O2 as tolerated - Consult pulmonary if no improvement  # Acute respiratory failure with hypoxia.  Due to pneumonia has resolved  # Diarrhea. Resolved.  #  Hyponatremia and Hypokalemia - resolved with IV fluids  # Elevated troponin, possible due to demanding ischemia. Continue ASA and lipitor. Minimal elevation in stable trend   All the records are reviewed  and case discussed with Care Management/Social Workerr. Management plans discussed with the patient, his wife and they are in agreement.  CODE STATUS: Full code  TOTAL TIME TAKING CARE OF THIS PATIENT: 30  POSSIBLE D/C IN 1-2 DAYS, DEPENDING ON CLINICAL CONDITION.   Milagros LollSudini, Donel Osowski R M.D on 04/14/2015 at 1:16 PM  Between 7am to 6pm - Pager - [phone]  After 6pm go to [email or web address] - password [email or web address]  [facility] [place] [facility]  Office  [phone]  CC: Primary care physician; Pcp Not In System

## 2015-04-15 ENCOUNTER — Inpatient Hospital Stay: Payer: No Typology Code available for payment source

## 2015-04-15 ENCOUNTER — Encounter: Payer: Self-pay | Admitting: Radiology

## 2015-04-15 LAB — CULTURE, BLOOD (ROUTINE X 2)
Culture: NO GROWTH
Culture: NO GROWTH

## 2015-04-15 LAB — RAPID HIV SCREEN (HIV 1/2 AB+AG)
HIV 1/2 Antibodies: NONREACTIVE
HIV-1 P24 Antigen - HIV24: NONREACTIVE

## 2015-04-15 LAB — STREP PNEUMONIAE URINARY ANTIGEN: Strep Pneumo Urinary Antigen: NEGATIVE

## 2015-04-15 MED ORDER — VANCOMYCIN HCL 10 G IV SOLR
1250.0000 mg | Freq: Once | INTRAVENOUS | Status: DC
  Filled 2015-04-15: qty 1250

## 2015-04-15 MED ORDER — VANCOMYCIN HCL 10 G IV SOLR: 1250.0000 mg | Freq: Two times a day (BID) | INTRAVENOUS | Status: DC

## 2015-04-15 MED ORDER — VANCOMYCIN HCL 10 G IV SOLR
1500.0000 mg | Freq: Once | INTRAVENOUS | Status: DC
  Filled 2015-04-15: qty 1500

## 2015-04-15 MED ORDER — SODIUM CHLORIDE 0.9 % IV SOLN
1250.0000 mg | Freq: Once | INTRAVENOUS | Status: DC
  Filled 2015-04-15: qty 1250

## 2015-04-15 MED ORDER — VANCOMYCIN HCL 10 G IV SOLR
1500.0000 mg | Freq: Once | INTRAVENOUS | Status: AC
  Administered 2015-04-15: 1500 mg via INTRAVENOUS
  Filled 2015-04-15: qty 1500

## 2015-04-15 MED ORDER — VANCOMYCIN HCL 10 G IV SOLR
1500.0000 mg | Freq: Two times a day (BID) | INTRAVENOUS | Status: DC
  Administered 2015-04-16: 1500 mg via INTRAVENOUS
  Filled 2015-04-15 (×2): qty 1500

## 2015-04-15 MED ORDER — IOHEXOL 300 MG/ML  SOLN
75.0000 mL | Freq: Once | INTRAMUSCULAR | Status: AC | PRN
  Administered 2015-04-15: 75 mL via INTRAVENOUS

## 2015-04-15 NOTE — Progress Notes (Signed)
Pharmacy Antibiotic Note  Ethan Warner is a 50 y.o. male admitted on 04/10/2015 with pneumonia.  Pharmacy has been consulted for Vancomycin dosing.  Patient also ordered Ceftriaxone 1 gm IV q24h and Azithromycin 500 mg po daily for CAP.  Plan: Will order Vancomycin 1500 mg IV once then administer a second dose in ~7 hours for stacked dosing. Vancomycin 1500 IV every 12 hours.  Goal trough 15-20 mcg/mL.   SCr: 1.01, est CrCl~96.9 mL/min, ke: 0.085, t1/2: 8.15 h, Vd: 62.3 L  Height: 5\' 9"  (175.3 cm) Weight: 198 lb (89.812 kg) IBW/kg (Calculated) : 70.7  Temp (24hrs), Avg:100.3 F (37.9 C), Min:98.8 F (37.1 C), Max:101.3 F (38.5 C)   Recent Labs Lab 04/10/15 0635 04/11/15 0420 04/13/15 0415  WBC 9.3 8.2  --   CREATININE 0.96 0.99 1.01    Estimated Creatinine Clearance: 96.9 mL/min (by C-G formula based on Cr of 1.01).    No Known Allergies  Antimicrobials this admission: Anti-infectives    Start     Dose/Rate Route Frequency Ordered Stop   04/16/15 1000  vancomycin (VANCOCIN) 1,250 mg in sodium chloride 0.9 % 250 mL IVPB  Status:  Discontinued     1,250 mg 166.7 mL/hr over 90 Minutes Intravenous Every 12 hours 04/15/15 1522 04/15/15 1524   04/16/15 1000  vancomycin (VANCOCIN) 1,500 mg in sodium chloride 0.9 % 500 mL IVPB     1,500 mg 250 mL/hr over 120 Minutes Intravenous Every 12 hours 04/15/15 1532     04/15/15 2300  vancomycin (VANCOCIN) 1,500 mg in sodium chloride 0.9 % 500 mL IVPB     1,500 mg 250 mL/hr over 120 Minutes Intravenous  Once 04/15/15 1535     04/15/15 2200  vancomycin (VANCOCIN) 1,250 mg in sodium chloride 0.9 % 250 mL IVPB  Status:  Discontinued     1,250 mg 166.7 mL/hr over 90 Minutes Intravenous  Once 04/15/15 1522 04/15/15 1524   04/15/15 2200  vancomycin (VANCOCIN) 1,500 mg in sodium chloride 0.9 % 500 mL IVPB  Status:  Discontinued     1,500 mg 250 mL/hr over 120 Minutes Intravenous  Once 04/15/15 1528 04/15/15 1535   04/15/15 1600  vancomycin  (VANCOCIN) 1,250 mg in sodium chloride 0.9 % 250 mL IVPB  Status:  Discontinued     1,250 mg 166.7 mL/hr over 90 Minutes Intravenous  Once 04/15/15 1520 04/15/15 1524   04/15/15 1600  vancomycin (VANCOCIN) 1,500 mg in sodium chloride 0.9 % 500 mL IVPB     1,500 mg 250 mL/hr over 120 Minutes Intravenous  Once 04/15/15 1524     04/12/15 1030  azithromycin (ZITHROMAX) tablet 500 mg     500 mg Oral Daily 04/12/15 1023     04/11/15 1000  azithromycin (ZITHROMAX) 500 mg in dextrose 5 % 250 mL IVPB  Status:  Discontinued     500 mg 250 mL/hr over 60 Minutes Intravenous Every 24 hours 04/10/15 0905 04/12/15 1023   04/10/15 1515  oseltamivir (TAMIFLU) capsule 75 mg     75 mg Oral 2 times daily 04/10/15 1509 04/14/15 2209   04/10/15 1000  cefTRIAXone (ROCEPHIN) 1 g in dextrose 5 % 50 mL IVPB     1 g 100 mL/hr over 30 Minutes Intravenous Every 24 hours 04/10/15 0905 04/17/15 0959   04/10/15 0730  levofloxacin (LEVAQUIN) IVPB 750 mg     750 mg 100 mL/hr over 90 Minutes Intravenous  Once 04/10/15 0725 04/10/15 0922      Dose adjustments  this admission: Check trough prior to dose on 3/18 at 0930  Microbiology results: Results for orders placed or performed during the hospital encounter of 04/10/15  Rapid Influenza A&B Antigens ([facility] only)     Status: None   Collection Time: 04/10/15  6:36 AM  Result Value Ref Range Status   Influenza A ([facility]) NEGATIVE NEGATIVE Final   Influenza B ([facility]) NEGATIVE NEGATIVE Final  Blood culture (routine x 2)     Status: None   Collection Time: 04/10/15  7:31 AM  Result Value Ref Range Status   Specimen Description BLOOD RIGHT FOREARM  Final   Special Requests BOTTLES DRAWN AEROBIC AND ANAEROBIC 1CCAERO,1CCANA  Final   Culture NO GROWTH 5 DAYS  Final   Report Status 04/15/2015 FINAL  Final  Blood culture (routine x 2)     Status: None   Collection Time: 04/10/15  7:57 AM  Result Value Ref Range Status   Specimen Description BLOOD LEFT FOREARM  Final    Special Requests BOTTLES DRAWN AEROBIC AND ANAEROBIC [id number],1CCANA  Final   Culture NO GROWTH 5 DAYS  Final   Report Status 04/15/2015 FINAL  Final   Thank you for allowing pharmacy to be a part of this patient's care.  Randen Kauth G 04/15/2015 3:36 PM

## 2015-04-15 NOTE — Progress Notes (Signed)
[facility]  at [facility]   PATIENT NAME: Ethan Warner    MR#:  [id number]  DATE OF BIRTH:  [birth year]  SUBJECTIVE:  CHIEF COMPLAINT:   Chief Complaint  Patient presents with  . Generalized Body Aches  . Diarrhea  . Fever    To 103.0 at home   Continues to have cough and poor appetite. No sputum. Febrile today with MAXIMUM TEMPERATURE of 101.2 Chest pain which was pleuritic is improving. Ambulating in the room.  REVIEW OF SYSTEMS:  CONSTITUTIONAL: no fever or weakness.  EYES: No blurred or double vision.  EARS, NOSE, AND THROAT: No tinnitus or ear pain.  RESPIRATORY: Has cough, mild shortness of breath, no wheezing or hemoptysis.  CARDIOVASCULAR: has chest pain while coughing, no orthopnea, edema.  GASTROINTESTINAL: no nausea, vomiting, no diarrhea or abdominal pain.  GENITOURINARY: No dysuria, hematuria.  ENDOCRINE: No polyuria, nocturia,  HEMATOLOGY: No anemia, easy bruising or bleeding SKIN: No rash or lesion. MUSCULOSKELETAL: No joint pain or arthritis.   NEUROLOGIC: No tingling, numbness, weakness.  PSYCHIATRY: No anxiety or depression.   DRUG ALLERGIES:  No Known Allergies  VITALS:  Blood pressure 107/65, pulse 104, temperature 101.2 F (38.4 C), temperature source Oral, resp. rate 18, height 5\' 9"  (1.753 m), weight 89.812 kg (198 lb), SpO2 95 %.  PHYSICAL EXAMINATION:  GENERAL:  50 y.o.-year-old patient lying in the bed with no acute distress.  EYES: Pupils equal, round, reactive to light and accommodation. No scleral icterus. Extraocular muscles intact.  HEENT: Head atraumatic, normocephalic. Oropharynx and nasopharynx clear.  NECK:  Supple, no jugular venous distention. No thyroid enlargement, no tenderness.  LUNGS: Normal breath sounds bilaterally, no wheezing, but has mild bilateral crackles. No use of accessory muscles of respiration.  CARDIOVASCULAR: S1, S2 normal. No murmurs, rubs, or gallops.  ABDOMEN: Soft,  nontender, nondistended. Bowel sounds present. No organomegaly or mass.  EXTREMITIES: No pedal edema, cyanosis, or clubbing.  NEUROLOGIC: Cranial nerves II through XII are intact. Muscle strength 5/5 in all extremities. Sensation intact. Gait not checked.  PSYCHIATRIC: The patient is alert and oriented x 3.  SKIN: No obvious rash, lesion, or ulcer.    LABORATORY PANEL:   CBC  Recent Labs Lab 04/11/15 0420  WBC 8.2  HGB 12.5*  HCT 37.0*  PLT 138*   ------------------------------------------------------------------------------------------------------------------  Chemistries   Recent Labs Lab 04/10/15 0635 04/11/15 0420 04/13/15 0415  NA 133* 133* 135  K 3.7 3.4* 3.9  CL 98* 99* 99*  CO2 25 26 27   GLUCOSE 119* 108* 137*  BUN 13 13 13   CREATININE 0.96 0.99 1.01  CALCIUM 8.5* 7.6* 8.1*  MG  --  1.9  --   AST 59*  --   --   ALT 35  --   --   ALKPHOS 53  --   --   BILITOT 0.8  --   --    ------------------------------------------------------------------------------------------------------------------  Cardiac Enzymes  Recent Labs Lab 04/11/15 1035  TROPONINI 0.09*   ------------------------------------------------------------------------------------------------------------------  RADIOLOGY:  No results found.  EKG:   Orders placed or performed during the hospital encounter of 04/10/15  . EKG 12-Lead  . EKG 12-Lead    ASSESSMENT AND PLAN:   # Bilateral influenza a pneumonitis with sepsis - worsening -IV Antibiotics , ceftriaxone and azithromycin. - Tamiflu - Nebulizers Continues to have fever with temperature 101.2 today. We'll check a CT scan of the chest. Sputum culture requested and pending. Further management and need for ID consultation  depending on CT scan results.  # Acute respiratory failure with hypoxia.  Due to pneumonia has resolved  # Diarrhea. Resolved.  # Hyponatremia and Hypokalemia - resolved with IV fluids  # Elevated troponin,  possible due to demanding ischemia. Continue ASA and lipitor. Minimal elevation in stable trend   All the records are reviewed and case discussed with Care Management/Social Workerr. Management plans discussed with the patient, his wife and they are in agreement.  CODE STATUS: Full code  TOTAL TIME TAKING CARE OF THIS PATIENT: 30 min  POSSIBLE D/C IN 1-2 DAYS, DEPENDING ON CLINICAL CONDITION.   Milagros Loll R M.D on 04/15/2015 at 2:05 PM  Between 7am to 6pm - Pager - [phone]  After 6pm go to [email or web address] - password [email or web address]  [facility]  Office  [phone]  CC: Primary care physician; Pcp Not In System

## 2015-04-16 ENCOUNTER — Inpatient Hospital Stay
Admit: 2015-04-16 | Discharge: 2015-04-16 | Disposition: A | Discharge status: Home / Self Care | Payer: No Typology Code available for payment source | Attending: Infectious Diseases | Admitting: Infectious Diseases

## 2015-04-16 ENCOUNTER — Encounter: Payer: Self-pay | Admitting: Internal Medicine

## 2015-04-16 DIAGNOSIS — J1100 Influenza due to unidentified influenza virus with unspecified type of pneumonia: Secondary | ICD-10-CM

## 2015-04-16 DIAGNOSIS — J189 Pneumonia, unspecified organism: Secondary | ICD-10-CM

## 2015-04-16 DIAGNOSIS — M3500 Sicca syndrome, unspecified: Secondary | ICD-10-CM

## 2015-04-16 DIAGNOSIS — J849 Interstitial pulmonary disease, unspecified: Secondary | ICD-10-CM

## 2015-04-16 DIAGNOSIS — R0600 Dyspnea, unspecified: Secondary | ICD-10-CM

## 2015-04-16 LAB — CBC WITH DIFFERENTIAL/PLATELET
BASOS ABS: 0 10*3/uL (ref 0–0.1)
Basophils Relative: 0 %
EOS ABS: 0.1 10*3/uL (ref 0–0.7)
Eosinophils Relative: 2 %
HCT: 34.5 % — ABNORMAL LOW (ref 40.0–52.0)
HEMOGLOBIN: 11.8 g/dL — AB (ref 13.0–18.0)
Lymphocytes Relative: 15 %
Lymphs Abs: 1 10*3/uL (ref 1.0–3.6)
MCH: 31 pg (ref 26.0–34.0)
MCHC: 34.3 g/dL (ref 32.0–36.0)
MCV: 90.5 fL (ref 80.0–100.0)
Monocytes Absolute: 1.1 10*3/uL — ABNORMAL HIGH (ref 0.2–1.0)
Monocytes Relative: 17 %
NEUTROS PCT: 66 %
Neutro Abs: 4.2 10*3/uL (ref 1.4–6.5)
Platelets: 471 10*3/uL — ABNORMAL HIGH (ref 150–440)
RBC: 3.81 MIL/uL — AB (ref 4.40–5.90)
RDW: 13.2 % (ref 11.5–14.5)
WBC: 6.4 10*3/uL (ref 3.8–10.6)

## 2015-04-16 MED ORDER — LEVOFLOXACIN 750 MG PO TABS
750.0000 mg | ORAL_TABLET | Freq: Every day | ORAL | Status: DC
  Administered 2015-04-16 – 2015-04-18 (×3): 750 mg via ORAL
  Filled 2015-04-16 (×3): qty 1

## 2015-04-16 NOTE — Progress Notes (Signed)
rn paged dr sudini x2 no reposne. Pt refuses to take lipitor. Will update md on saturday

## 2015-04-16 NOTE — Consult Note (Signed)
[facility] Infectious [facility]     Reason for Consult:Pna   Referring Physician: Boykin Warner Date of Admission:  04/10/2015   Principal Problem:   Pneumonia    HPI: Ethan Warner is a 50 y.o. 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 Flu PCR +.  Treated with tamiflu but then developed recurrent fever and cxr findings. CT was done showing findings of LAN, pericardial effusion, reticular changes esp in lower lobes. 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  He reports about a 20 # wt loss but cannot specify time frame (maybe 1 year), some increasing DOE on going up stairs. No CP, rash, joint sxs. No TB contacts, no overseas travel, prison or [employer] time. 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 No animal exposures. No tobacco, some etoh.    History reviewed. No pertinent past medical history. Past Surgical History  Procedure Laterality Date  . Knee arthroscopy    . Hand surgery     Social History  Substance Use Topics  . Smoking status: Never Smoker   . Smokeless tobacco: None  . Alcohol Use: 0.0 oz/week    0 Standard drinks or equivalent per week   Family History  Problem Relation Age of Onset  . Cancer Father     Allergies: No Known Allergies  Current antibiotics: Antibiotics Given (last 72 hours)    Date/Time Action Medication Dose Rate   04/13/15 2157 Given   oseltamivir (TAMIFLU) capsule 75 mg 75 mg    04/14/15 7681 Given   cefTRIAXone (ROCEPHIN) 1 g in dextrose 5 % 50 mL IVPB 1 g 100 mL/hr   04/14/15 1572 Given   oseltamivir (TAMIFLU) capsule 75 mg 75 mg    04/14/15 0832 Given   azithromycin (ZITHROMAX) tablet 500 mg 500 mg    04/14/15 2209 Given   oseltamivir (TAMIFLU) capsule 75 mg 75 mg    04/15/15 0925 Given   cefTRIAXone (ROCEPHIN) 1 g in dextrose 5 % 50 mL IVPB 1 g 100  mL/hr   04/15/15 0925 Given   azithromycin (ZITHROMAX) tablet 500 mg 500 mg    04/15/15 1646 Given   vancomycin (VANCOCIN) 1,500 mg in sodium chloride 0.9 % 500 mL IVPB 1,500 mg 250 mL/hr   04/15/15 2248 Given   vancomycin (VANCOCIN) 1,500 mg in sodium chloride 0.9 % 500 mL IVPB 1,500 mg 250 mL/hr   04/16/15 0835 Given   azithromycin (ZITHROMAX) tablet 500 mg 500 mg    04/16/15 0836 Given   cefTRIAXone (ROCEPHIN) 1 g in dextrose 5 % 50 mL IVPB 1 g 100 mL/hr   04/16/15 0912 Given   vancomycin (VANCOCIN) 1,500 mg in sodium chloride 0.9 % 500 mL IVPB 1,500 mg 250 mL/hr      MEDICATIONS: . aspirin EC  325 mg Oral Daily  . atorvastatin  40 mg Oral q1800  . azithromycin  500 mg Oral Daily  . enoxaparin (LOVENOX) injection  40 mg Subcutaneous Q24H  . sodium chloride flush  3 mL Intravenous Q12H  . vancomycin  1,500 mg Intravenous Q12H    Review of Systems - 11 systems reviewed and negative per HPI  OBJECTIVE: Temp:  [99.1 F (37.3 C)-99.5 F (37.5 C)] 99.5 F (37.5 C) (03/17 0802) Pulse Rate:  [89-97] 91 (03/17 0802) Resp:  [  18-20] 18 (03/17 0802) BP: (108-140)/(62-88) 126/81 mmHg (03/17 0802) SpO2:  [94 %-97 %] 94 % (03/17 0802) Physical Exam  Constitutional: He is oriented to person, place, and time. He appears well-developed and well-nourished. No distress.  HENT: PERRLA, eomi Mouth/Throat: Oropharynx is clear and moist. No oropharyngeal exudate.  Cardiovascular: Normal rate, regular rhythm and normal heart sounds. Pulmonary/Chest:cracles l base Abdominal: Soft. Bowel sounds are normal. He exhibits no distension. There is no tenderness.  Lymphadenopathy: He has no cervical adenopathy.  Neurological: He is alert and oriented to person, place, and time.  Skin: Skin is warm and dry. No rash noted. No erythema.  Psychiatric: He has a normal mood and affect. His behavior is normal.    LABS: Results for orders placed or performed during the hospital encounter of 04/10/15 (from  the past 48 hour(s))  Strep pneumoniae urinary antigen     Status: None   Collection Time: 04/15/15  4:00 AM  Result Value Ref Range   Strep Pneumo Urinary Antigen NEGATIVE NEGATIVE    Comment: Performed at [facility]        Infection due to S. pneumoniae cannot be absolutely ruled out since the antigen present may be below the detection limit of the test. Performed at [facility]   Rapid HIV screen (HIV 1/2 Ab+Ag)     Status: None   Collection Time: 04/15/15  9:49 PM  Result Value Ref Range   HIV-1 P24 Antigen - HIV24 NON REACTIVE NON REACTIVE   HIV 1/2 Antibodies NON REACTIVE NON REACTIVE   Interpretation (HIV Ag Ab)      A non reactive test result means that HIV 1 or HIV 2 antibodies and HIV 1 p24 antigen were not detected in the specimen.  CBC with Differential/Platelet     Status: Abnormal   Collection Time: 04/16/15  4:56 AM  Result Value Ref Range   WBC 6.4 3.8 - 10.6 K/uL   RBC 3.81 (L) 4.40 - 5.90 MIL/uL   Hemoglobin 11.8 (L) 13.0 - 18.0 g/dL   HCT 34.5 (L) 40.0 - 52.0 %   MCV 90.5 80.0 - 100.0 fL   MCH 31.0 26.0 - 34.0 pg   MCHC 34.3 32.0 - 36.0 g/dL   RDW 13.2 11.5 - 14.5 %   Platelets 471 (H) 150 - 440 K/uL   Neutrophils Relative % 66 %   Lymphocytes Relative 15 %   Monocytes Relative 17 %   Eosinophils Relative 2 %   Basophils Relative 0 %   Neutro Abs 4.2 1.4 - 6.5 K/uL   Lymphs Abs 1.0 1.0 - 3.6 K/uL   Monocytes Absolute 1.1 (H) 0.2 - 1.0 K/uL   Eosinophils Absolute 0.1 0 - 0.7 K/uL   Basophils Absolute 0.0 0 - 0.1 K/uL   Smear Review MORPHOLOGY UNREMARKABLE    No components found for: ESR, C REACTIVE PROTEIN MICRO: Recent Results (from the past 720 hour(s))  Rapid Influenza A&B Antigens ([facility] only)     Status: None   Collection Time: 04/10/15  6:36 AM  Result Value Ref Range Status   Influenza A ([facility]) NEGATIVE NEGATIVE Final   Influenza B ([facility]) NEGATIVE NEGATIVE Final  Blood culture (routine x 2)     Status: None   Collection  Time: 04/10/15  7:31 AM  Result Value Ref Range Status   Specimen Description BLOOD RIGHT FOREARM  Final   Special Requests BOTTLES DRAWN AEROBIC AND ANAEROBIC [place]  Final   Culture NO GROWTH 5  DAYS  Final   Report Status 04/15/2015 FINAL  Final  Blood culture (routine x 2)     Status: None   Collection Time: 04/10/15  7:57 AM  Result Value Ref Range Status   Specimen Description BLOOD LEFT FOREARM  Final   Special Requests BOTTLES DRAWN AEROBIC AND ANAEROBIC [place]  Final   Culture NO GROWTH 5 DAYS  Final   Report Status 04/15/2015 FINAL  Final    IMAGING: Dg Chest 2 View  04/12/2015  CLINICAL DATA:  Fever and cough EXAM: CHEST  2 VIEW COMPARISON:  04/10/2015 FINDINGS: Cardiac shadow is stable. Increased bibasilar infiltrates are noted with increasing right-sided pleural effusion. No bony abnormality is seen. IMPRESSION: Increasing bibasilar infiltrates. Electronically Signed   By: Inez Catalina M.D.   On: 04/12/2015 12:18   Dg Chest 2 View  04/10/2015  CLINICAL DATA:  Body aches and fever.  Nausea and vomiting. EXAM: CHEST  2 VIEW COMPARISON:  January 09, 2014 FINDINGS: No pneumothorax. Bibasilar opacities are seen, right greater than left. Obscuration of the heart on the lateral view suggests a right middle lobe or lingula component as well. The cardiomediastinal silhouette is stable. A small effusion is seen on the lateral view. No other acute abnormalities. IMPRESSION: Bibasilar opacities. Opacity in the right middle lobe or lingula based on the lateral view. Small effusion. The findings suggest multi focal pneumonia given history. Electronically Signed   By: Dorise Bullion III M.D   On: 04/10/2015 07:09   Ct Chest W Contrast  04/15/2015  CLINICAL DATA:  Pt. Was admitted to the hospital 1 week ago for flu like symptoms and pneumonia. Pt. Still having SOB, cough and fever. No hx of CA. EXAM: CT CHEST WITH CONTRAST TECHNIQUE: Multidetector CT imaging of the chest was  performed during intravenous contrast administration. CONTRAST:  44m OMNIPAQUE IOHEXOL 300 MG/ML  SOLN COMPARISON:  Chest x-ray 04/12/2015 FINDINGS: Heart: Moderate pericardial effusion is identified, measuring up to 1.8 cm at the interventricular septum. No significant coronary artery calcification noted. The heart is normal in size. Vascular structures: Thoracic aorta is normal in appearance with only minimal atherosclerotic disease noted. The pulmonary arteries are grossly normal in appearance. Mediastinum/thyroid: The visualized portion of the thyroid gland has a normal appearance. Innumerable mediastinal lymph nodes are present. In the pretracheal region lymph node is 1.8 cm. AP window node 2.4 cm. Large number of sub cm lymph nodes in the anterior mediastinum, right peritracheal region, subcarinal region, and hilar regions. Azygo-esophageal recess node is 1.7 cm. Final lymph nodes are identified in the axillary regions bilaterally, left greater than right. Largest node in the left axillary region is 2.3 cm. Lungs/Airways: Small bilateral pleural effusions. Prominent reticular opacities primarily the periphery of the lung and at the lung bases, right greater than left. Opacities are confluent within the right lower lobe and associated with air bronchograms. Upper abdomen: Right renal cyst is present. No focal abnormality identified within the visualized portions of the liver, spleen, pancreas, or adrenal glands. Chest wall/osseous structures: Bilateral gynecomastia, left greater than right. Visualized osseous structures have a normal appearance. IMPRESSION: 1. Moderate pericardial effusion. 2. Significant adenopathy involving the mediastinum, hilar regions, and axillary regions. 3. Prominent reticular changes throughout the periphery of the lungs, especially involving the lower lobes, right greater than left. Considerations include usual interstitial pneumonia, drug reaction, lymphangitic carcinomatosis,  pulmonary lymphoma, sarcoidosis, or collagen vascular disease. Post primary tuberculosis can have this appearance and should be excluded. 4. Given the significant  adenopathy, malignancy remains a consideration. Electronically Signed   By: Nolon Nations M.D.   On: 04/15/2015 15:00   05/05/14 ANA + RNP > 8, ANti SSA > 8 esr 31, crp 12.6  Assessment:   Ethan Warner is a 50 y.o. 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 Flu PCR +.  Treated with tamiflu but then developed recurrent fever and cxr findings. CT was done showing findings of LAN, pericardial effusion, reticular changes esp in lower lobes. 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  He reports about a 20 # wt loss but cannot specify time frame (maybe 1 year), some increasing DOE on going up stairs. No CP, rash, joint sxs. No TB contacts, no overseas travel, prison or [employer] time. 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 No animal exposures. No tobacco, some etoh.   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  His HIV is negative.    Recommendations Check echo for the pericardial effusion.  Spoke with and consulted Pulmonary, will need to see rheum as otpt .  Would finish another 5 days of abx - can change to levo at home- can stop vanco since this does not appear to be MRSA which usual causes a more necrotizing infection and these changes appear more subacute  Thank you very much for allowing me to participate in the care of this patient. Please call with questions.   Cheral Marker. Ola Spurr, MD

## 2015-04-16 NOTE — Progress Notes (Signed)
Pharmacy Antibiotic Note  Ethan Warner is a 50 y.o. male admitted on 04/10/2015 with pneumonia/ sepsis  s/p Influenza.  Pharmacy has been consulted for Levaquin dosing. Patient initally on CTX/Azith- spiked fever then on Azith/Vanc. ID recommends d/c Vanc and transition to Levaquin for home.  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  Plan: Will start Levaquin 750 mg PO Daily.  Height: 5\' 9"  (175.3 cm) Weight: 198 lb (89.812 kg) IBW/kg (Calculated) : 70.7  Temp (24hrs), Avg:99.3 F (37.4 C), Min:99.1 F (37.3 C), Max:99.5 F (37.5 C)   Recent Labs Lab 04/10/15 0635 04/11/15 0420 04/13/15 0415 04/16/15 0456  WBC 9.3 8.2  --  6.4  CREATININE 0.96 0.99 1.01  --     Estimated Creatinine Clearance: 96.9 mL/min (by C-G formula based on Cr of 1.01).    No Known Allergies  Antimicrobials this admission: CTX 3/11 >> 3/16 Azith 3/11  >> 3/17  Vanc 3/16>> 3/17  Levaquin 3/ 17 >>  Dose adjustments this admission:   Microbiology results: 3/11 BCx: negative UCx: Sputum:  MRSA PCR:   Thank you for allowing pharmacy to be a part of this patient's care.  Karrigan Messamore A 04/16/2015 2:49 PM

## 2015-04-16 NOTE — Consult Note (Addendum)
PULMONARY / CRITICAL CARE MEDICINE   Name: Ethan Warner MRN: [id number] DOB: [birth year]    ADMISSION DATE:  04/10/2015 CONSULTATION DATE:  04/16/15  REFERRING MD :  Dr. Ola Spurr   CHIEF COMPLAINT:   SOB Reason for consultation-shortness of breath, abnormal CT  HISTORY OF PRESENT ILLNESS   50 year old male past medical history of Sjogren's disease in 2015, seen in consultation for mild shortness of breath and abnormal CT of chest. History per patient and wife at bedside. Patient was admitted on 04/10/2015 with a diagnosis of flu, influenza A positive PCR. He is treated with Tamiflu, would continue to have recurrent fever and abnormal chest x-ray findings. CT scan done showed lymphadenopathy, pericardial effusion, prominent reticular changes bilateral lower lobes (right greater than left) disease. Initially early in his course of admission required oxygen and now has been completely weaned off. He reports about 10-20 pound weight loss in the last 3 months, endorses night sweats over the last 2-3 months, endorses dyspnea on exertion prior to admission and when going upstairs. No chest pain, no rash no joint symptoms. No history of TB contacts, no out of country travel, no visitation to the prison on [address]. 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 Patient is a never smoker and a social drinker. About one year ago, he had worsening complaints of a dry mouth and dry throat, he follow-up with ENT and was referred to internal medicine for further workup, further workup showed he had Sjogrens disease, not requiring any significant treatment, supportively treated. Denies any hemoptysis in the past 3 months,   SIGNIFICANT EVENTS   05/05/2014-diagnosed with troponins, not requiring any significant treatment. Labs positive for ANA, anti-RNP, anti-SSA,  elevated sedimentation rate, elevated CRP   PAST MEDICAL HISTORY    :  History reviewed. No pertinent past medical history. Past Surgical History  Procedure Laterality Date  . Knee arthroscopy    . Hand surgery     Prior to Admission medications   Not on File   No Known Allergies   FAMILY HISTORY   Family History  Problem Relation Age of Onset  . Cancer Father       SOCIAL HISTORY    reports that he has never smoked. He does not have any smokeless tobacco history on file. He reports that he drinks alcohol. His drug history is not on file.  Review of Systems  Constitutional: Positive for weight loss and malaise/fatigue. Negative for fever and chills.  HENT: Positive for congestion and sore throat. Negative for ear discharge, ear pain, hearing loss, nosebleeds and tinnitus.   Eyes: Negative for blurred vision, double vision and photophobia.  Respiratory: Positive for cough, sputum production and shortness of breath. Negative for hemoptysis, wheezing and stridor.   Cardiovascular: Negative for chest pain, palpitations, orthopnea, claudication, leg swelling and PND.  Gastrointestinal: Negative for heartburn, nausea, vomiting, abdominal pain and diarrhea.  Genitourinary: Negative for dysuria and urgency.  Musculoskeletal: Negative for myalgias, back pain, joint pain and neck pain.  Skin: Negative for itching and rash.  Neurological: Negative for dizziness, tingling, weakness and headaches.  Endo/Heme/Allergies: Negative for environmental allergies. Does not bruise/bleed easily.  Psychiatric/Behavioral: Negative for depression and suicidal ideas.      VITAL SIGNS    Temp:  [99.1 F (37.3 C)-99.5 F (37.5 C)] 99.5 F (37.5 C) (03/17 0802) Pulse Rate:  [  89-97] 91 (03/17 0802) Resp:  [18-20] 18 (03/17 0802) BP: (108-140)/(62-88) 126/81 mmHg (03/17 0802) SpO2:  [94 %-97 %] 94 % (03/17 0802) HEMODYNAMICS:   VENTILATOR SETTINGS:   INTAKE / OUTPUT:  Intake/Output  Summary (Last 24 hours) at 04/16/15 1445 Last data filed at 04/16/15 0842  Gross per 24 hour  Intake      0 ml  Output      0 ml  Net      0 ml       PHYSICAL EXAM   Physical Exam  Constitutional: He is oriented to person, place, and time. He appears well-developed and well-nourished.  HENT:  Head: Normocephalic and atraumatic.  Right Ear: External ear normal.  Left Ear: External ear normal.  Nose: Nose normal.  Mouth/Throat: Oropharynx is clear and moist.  Eyes: Conjunctivae and EOM are normal. Pupils are equal, round, and reactive to light.  Neck: Normal range of motion. Neck supple.  Cardiovascular: Normal rate, regular rhythm, normal heart sounds and intact distal pulses.   Pulmonary/Chest: Effort normal. No respiratory distress. He has no wheezes. He exhibits no tenderness.  Dry crackles at RLL  Abdominal: Soft. Bowel sounds are normal. He exhibits no distension. There is no tenderness.  Musculoskeletal: Normal range of motion. He exhibits no edema.  Neurological: He is alert and oriented to person, place, and time. He has normal reflexes.  Skin: Skin is warm and dry. No rash noted. No erythema.  Psychiatric: He has a normal mood and affect.  Nursing note and vitals reviewed.      LABS   LABS:  CBC  Recent Labs Lab 04/10/15 0635 04/11/15 0420 04/16/15 0456  WBC 9.3 8.2 6.4  HGB 14.2 12.5* 11.8*  HCT 41.6 37.0* 34.5*  PLT 158 138* 471*   Coag's No results for input(s): APTT, INR in the last 168 hours. BMET  Recent Labs Lab 04/10/15 0635 04/11/15 0420 04/13/15 0415  NA 133* 133* 135  K 3.7 3.4* 3.9  CL 98* 99* 99*  CO2 25 26 27   BUN 13 13 13   CREATININE 0.96 0.99 1.01  GLUCOSE 119* 108* 137*   Electrolytes  Recent Labs Lab 04/10/15 0635 04/11/15 0420 04/13/15 0415  CALCIUM 8.5* 7.6* 8.1*  MG  --  1.9  --    Sepsis Markers No results for input(s): LATICACIDVEN, PROCALCITON, O2SATVEN in the last 168 hours. ABG No results for  input(s): PHART, PCO2ART, PO2ART in the last 168 hours. Liver Enzymes  Recent Labs Lab 04/10/15 0635  AST 59*  ALT 35  ALKPHOS 53  BILITOT 0.8  ALBUMIN 3.5   Cardiac Enzymes  Recent Labs Lab 04/10/15 2304 04/11/15 0420 04/11/15 1035  TROPONINI 0.10* 0.08* 0.09*   Glucose No results for input(s): GLUCAP in the last 168 hours.   Recent Results (from the past 240 hour(s))  Rapid Influenza A&B Antigens ([facility] only)     Status: None   Collection Time: 04/10/15  6:36 AM  Result Value Ref Range Status   Influenza A ([facility]) NEGATIVE NEGATIVE Final   Influenza B ([facility]) NEGATIVE NEGATIVE Final  Blood culture (routine x 2)     Status: None   Collection Time: 04/10/15  7:31 AM  Result Value Ref Range Status   Specimen Description BLOOD RIGHT FOREARM  Final   Special Requests BOTTLES DRAWN AEROBIC AND ANAEROBIC [place]  Final   Culture NO GROWTH 5 DAYS  Final   Report Status 04/15/2015 FINAL  Final  Blood culture (routine x 2)  Status: None   Collection Time: 04/10/15  7:57 AM  Result Value Ref Range Status   Specimen Description BLOOD LEFT FOREARM  Final   Special Requests BOTTLES DRAWN AEROBIC AND ANAEROBIC [place]  Final   Culture NO GROWTH 5 DAYS  Final   Report Status 04/15/2015 FINAL  Final     Current facility-administered medications:  .  0.9 %  sodium chloride infusion, 250 mL, Intravenous, PRN, Demetrios Loll, MD .  acetaminophen (TYLENOL) tablet 650 mg, 650 mg, Oral, Q6H PRN, Hillary Bow, MD .  aspirin EC tablet 325 mg, 325 mg, Oral, Daily, Demetrios Loll, MD, 325 mg at 04/16/15 0835 .  atorvastatin (LIPITOR) tablet 40 mg, 40 mg, Oral, q1800, Demetrios Loll, MD, 40 mg at 04/15/15 1653 .  enoxaparin (LOVENOX) injection 40 mg, 40 mg, Subcutaneous, Q24H, Demetrios Loll, MD, 40 mg at 04/16/15 0835 .  guaiFENesin (ROBITUSSIN) 100 MG/5ML solution 100 mg, 5 mL, Oral, Q4H PRN, Demetrios Loll, MD .  ibuprofen (ADVIL,MOTRIN) tablet 400 mg, 400 mg, Oral, Q6H PRN, Hillary Bow, MD,  400 mg at 04/14/15 1542 .  ipratropium-albuterol (DUONEB) 0.5-2.5 (3) MG/3ML nebulizer solution 3 mL, 3 mL, Nebulization, Q4H PRN, Demetrios Loll, MD, 3 mL at 04/10/15 2244 .  levofloxacin (LEVAQUIN) tablet 750 mg, 750 mg, Oral, Daily, Srikar Sudini, MD .  ondansetron [facility]) injection 4 mg, 4 mg, Intravenous, Q4H PRN, Demetrios Loll, MD, 4 mg at 04/11/15 1049 .  oxyCODONE-acetaminophen (PERCOCET/ROXICET) 5-325 MG per tablet 1 tablet, 1 tablet, Oral, Q6H PRN, Demetrios Loll, MD, 1 tablet at 04/16/15 0503 .  sodium chloride flush (NS) 0.9 % injection 3 mL, 3 mL, Intravenous, Q12H, Demetrios Loll, MD, 3 mL at 04/15/15 0926 .  sodium chloride flush (NS) 0.9 % injection 3 mL, 3 mL, Intravenous, PRN, Demetrios Loll, MD  IMAGING    No results found.    Indwelling Urinary Catheter continued, requirement due to   Reason to continue Indwelling Urinary Catheter for strict Intake/Output monitoring for hemodynamic instability   Central Line continued, requirement due to   Reason to continue [employer] Monitoring of central venous pressure or other hemodynamic parameters   Ventilator continued, requirement due to, resp failure    Ventilator Sedation RASS 0 to -2   Influenza A PCR positive  Labs 05/05/14 ANA + RNP > 8, ANti SSA > 8 esr 31, crp 12.6  ASSESSMENT/PLAN  50 year old male seen in consultation for abnormal CAT scan concerning for interstitial lung disease, recently diagnosed with influenza A, now improving.  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 -Pulmonary reticular opacities primarily in the periphery of the lung, right greater than left, noted air bronchograms. 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 -Walk test to determine oxygen needs prior to discharge -Incentive spirometry - NO steroids needed as patient as clinical improvement, and is not requiring any further O2 at rest.  -Continue with treatment of underlying disease, i.e. influenza A -May follow-up with rheumatology as an outpatient  Pericardial effusion -Secondary to recent viral infection most likely -Currently other than mild shortness  of breath which is improving he is not severely symptomatic -Echo pending -ID following   [place] [facility] will call patient with appointment follow-up details.  Thank you for consulting [facility], we will signoff at this time.  Please feel free to contact us with any questions at [phone] (please enter 7-digits).   I have personally obtained a history, examined the patient, evaluated laboratory and imaging results, formulated the assessment and plan and placed orders.   Pulmonary Care Time devoted to patient care services described in this note is 40 minutes.    Vilinda Boehringer, MD [facility] Pager [phone] (please enter 7-digits) On Call Pager [phone] (please enter 7-digits)     04/16/2015, 2:45 PM  Note: This note was prepared with Dragon dictation along with smaller phrase technology. Any transcriptional errors that result from this process are unintentional.

## 2015-04-16 NOTE — Progress Notes (Signed)
[facility] [place] at [facility]   PATIENT NAME: Ethan Warner    MR#:  [id number]  DATE OF BIRTH:  02/08/1965  SUBJECTIVE:  CHIEF COMPLAINT:   Chief Complaint  Patient presents with  . Generalized Body Aches  . Diarrhea  . Fever    To 103.0 at home   Continues to have cough and poor appetite. No sputum. Fever curve has improved. Started on vancomycin yesterday.  REVIEW OF SYSTEMS:  CONSTITUTIONAL: no fever or weakness.  EYES: No blurred or double vision.  EARS, NOSE, AND THROAT: No tinnitus or ear pain.  RESPIRATORY: Has cough, mild shortness of breath, no wheezing or hemoptysis.  CARDIOVASCULAR: has chest pain while coughing, no orthopnea, edema.  GASTROINTESTINAL: no nausea, vomiting, no diarrhea or abdominal pain.  GENITOURINARY: No dysuria, hematuria.  ENDOCRINE: No polyuria, nocturia,  HEMATOLOGY: No anemia, easy bruising or bleeding SKIN: No rash or lesion. MUSCULOSKELETAL: No joint pain or arthritis.   NEUROLOGIC: No tingling, numbness, weakness.  PSYCHIATRY: No anxiety or depression.   DRUG ALLERGIES:  No Known Allergies  VITALS:  Blood pressure 126/81, pulse 91, temperature 99.5 F (37.5 C), temperature source Oral, resp. rate 18, height  (1.753 m), weight 89.812 kg (198 lb), SpO2 94 %.  PHYSICAL EXAMINATION:  GENERAL:  50 y.o.-year-old patient lying in the bed with no acute distress.  EYES: Pupils equal, round, reactive to light and accommodation. No scleral icterus. Extraocular muscles intact.  HEENT: Head atraumatic, normocephalic. Oropharynx and nasopharynx clear.  NECK:  Supple, no jugular venous distention. No thyroid enlargement, no tenderness.  LUNGS: Normal breath sounds bilaterally, no wheezing, but has mild bilateral crackles. No use of accessory muscles of respiration.  CARDIOVASCULAR: S1, S2 normal. No murmurs, rubs, or gallops.  ABDOMEN: Soft, nontender, nondistended. Bowel sounds present. No organomegaly or  mass.  EXTREMITIES: No pedal edema, cyanosis, or clubbing.  NEUROLOGIC: Cranial nerves II through XII are intact. Muscle strength 5/5 in all extremities. Sensation intact. Gait not checked.  PSYCHIATRIC: The patient is alert and oriented x 3.  SKIN: No obvious rash, lesion, or ulcer.    LABORATORY PANEL:   CBC  Recent Labs Lab 04/16/15 0456  WBC 6.4  HGB 11.8*  HCT 34.5*  PLT 471*   ------------------------------------------------------------------------------------------------------------------  Chemistries   Recent Labs Lab 04/10/15 0635 04/11/15 0420 04/13/15 0415  NA 133* 133* 135  K 3.7 3.4* 3.9  CL 98* 99* 99*  CO2 GLUCOSE 119* 108* 137*  BUN CREATININE 0.96 0.99 1.01  CALCIUM 8.5* 7.6* 8.1*  MG  --  1.9  --   AST 59*  --   --   ALT 35  --   --   ALKPHOS 53  --   --   BILITOT 0.8  --   --    ------------------------------------------------------------------------------------------------------------------  Cardiac Enzymes  Recent Labs Lab 04/11/15 1035  TROPONINI 0.09*   ------------------------------------------------------------------------------------------------------------------  RADIOLOGY:  Ct Chest W Contrast  04/15/2015  CLINICAL DATA:  Pt. Was admitted to the hospital 1 week ago for flu like symptoms and pneumonia. Pt. Still having SOB, cough and fever. No hx of CA. EXAM: CT CHEST WITH CONTRAST TECHNIQUE: Multidetector CT imaging of the chest was performed during intravenous contrast administration. CONTRAST:  75mL OMNIPAQUE IOHEXOL 300 MG/ML  SOLN COMPARISON:  Chest x-ray 04/12/2015 FINDINGS: Heart: Moderate pericardial effusion is identified, measuring up to 1.8 cm at the interventricular septum. No significant coronary artery calcification noted.  The heart is normal in size. Vascular structures: Thoracic aorta is normal in appearance with only minimal atherosclerotic disease noted. The pulmonary arteries are grossly normal in  appearance. Mediastinum/thyroid: The visualized portion of the thyroid gland has a normal appearance. Innumerable mediastinal lymph nodes are present. In the pretracheal region lymph node is 1.8 cm. AP window node 2.4 cm. Large number of sub cm lymph nodes in the anterior mediastinum, right peritracheal region, subcarinal region, and hilar regions. Azygo-esophageal recess node is 1.7 cm. Final lymph nodes are identified in the axillary regions bilaterally, left greater than right. Largest node in the left axillary region is 2.3 cm. Lungs/Airways: Small bilateral pleural effusions. Prominent reticular opacities primarily the periphery of the lung and at the lung bases, right greater than left. Opacities are confluent within the right lower lobe and associated with air bronchograms. Upper abdomen: Right renal cyst is present. No focal abnormality identified within the visualized portions of the liver, spleen, pancreas, or adrenal glands. Chest wall/osseous structures: Bilateral gynecomastia, left greater than right. Visualized osseous structures have a normal appearance. IMPRESSION: 1. Moderate pericardial effusion. 2. Significant adenopathy involving the mediastinum, hilar regions, and axillary regions. 3. Prominent reticular changes throughout the periphery of the lungs, especially involving the lower lobes, right greater than left. Considerations include usual interstitial pneumonia, drug reaction, lymphangitic carcinomatosis, pulmonary lymphoma, sarcoidosis, or collagen vascular disease. Post primary tuberculosis can have this appearance and should be excluded. 4. Given the significant adenopathy, malignancy remains a consideration. Electronically Signed   By: Norva PavlovElizabeth  Brown M.D.   On: 04/15/2015 15:00    EKG:   Orders placed or performed during the hospital encounter of 04/10/15  . EKG 12-Lead  . EKG 12-Lead    ASSESSMENT AND PLAN:   # Bilateral influenza a pneumonitis with sepsis - CT scan showed  bilateral infiltrates or significant lymphadenopathy. -IV Antibiotics , ceftriaxone and azithromycin. - Tamiflu - Nebulizers Fever curve has improved.  Consult infectious disease and discussed with Dr. Sampson GoonFitzgerald. Check echocardiogram. Consult pulmonary. Stop vancomycin.  # Acute respiratory failure with hypoxia.  Due to pneumonia and has resolved  # Diarrhea. Resolved.  # Hyponatremia and Hypokalemia - resolved with IV fluids  # Elevated troponin, possible due to demanding ischemia. Continue ASA and lipitor. Minimal elevation in stable trend   possible discharge tomorrow if afebrile  All the records are reviewed and case discussed with Care Management/Social Workerr. Management plans discussed with the patient and is in agreement.  CODE STATUS: Full code  TOTAL TIME TAKING CARE OF THIS PATIENT: 30 min  POSSIBLE D/C IN 1-2 DAYS, DEPENDING ON CLINICAL CONDITION.   Milagros LollSudini, Jackson Coffield R M.D on 04/16/2015 at 2:40 PM  Between 7am to 6pm - Pager - [phone]  After 6pm go to [email or web address] - password [email or web address]  [facility]  Office  [phone]  CC: Primary care physician; Pcp Not In System

## 2015-04-16 NOTE — Progress Notes (Signed)
*  PRELIMINARY RESULTS* Echocardiogram 2D Echocardiogram has been performed.  Ethan Warner 04/16/2015, 6:53 PM

## 2015-04-17 DIAGNOSIS — I32 Pericarditis in diseases classified elsewhere: Secondary | ICD-10-CM

## 2015-04-17 LAB — CREATININE, SERUM: CREATININE: 0.83 mg/dL (ref 0.61–1.24)

## 2015-04-17 LAB — ECHOCARDIOGRAM COMPLETE
HEIGHTINCHES: 69 in
WEIGHTICAEL: 3168 [oz_av]

## 2015-04-17 MED ORDER — POTASSIUM CHLORIDE CRYS ER 20 MEQ PO TBCR
20.0000 meq | EXTENDED_RELEASE_TABLET | Freq: Every day | ORAL | Status: DC
  Administered 2015-04-17 – 2015-04-18 (×2): 20 meq via ORAL
  Filled 2015-04-17 (×2): qty 1

## 2015-04-17 MED ORDER — IBUPROFEN 400 MG PO TABS
400.0000 mg | ORAL_TABLET | Freq: Four times a day (QID) | ORAL | Status: DC
Start: 2015-04-17 — End: 2015-04-18
  Administered 2015-04-17 – 2015-04-18 (×5): 400 mg via ORAL
  Filled 2015-04-17 (×5): qty 1

## 2015-04-17 MED ORDER — IPRATROPIUM-ALBUTEROL 0.5-2.5 (3) MG/3ML IN SOLN
3.0000 mL | RESPIRATORY_TRACT | Status: DC
  Administered 2015-04-17 – 2015-04-18 (×6): 3 mL via RESPIRATORY_TRACT
  Filled 2015-04-17 (×6): qty 3

## 2015-04-17 MED ORDER — COLCHICINE 0.6 MG PO TABS
0.6000 mg | ORAL_TABLET | Freq: Two times a day (BID) | ORAL | Status: DC
  Administered 2015-04-17 – 2015-04-18 (×3): 0.6 mg via ORAL
  Filled 2015-04-17 (×3): qty 1

## 2015-04-17 MED ORDER — FUROSEMIDE 20 MG PO TABS
20.0000 mg | ORAL_TABLET | Freq: Every day | ORAL | Status: DC
  Administered 2015-04-17 – 2015-04-18 (×2): 20 mg via ORAL
  Filled 2015-04-17 (×2): qty 1

## 2015-04-17 NOTE — Progress Notes (Signed)
Pt wanting to go home, stating Dr. Nydia Boutonalwood was coming to see him, The doctor was not coming back until tomorrow, he had already been in to see him.

## 2015-04-17 NOTE — Progress Notes (Signed)
Called Dr. Nydia Boutonalwood, he said he would see pt on Sunday. Told patient.

## 2015-04-17 NOTE — Progress Notes (Signed)
Pt has been ready to go home, has refused his meals stated he di not want it. Wanted to leave AMA today. explained to him how it worked to him and family. He said he would stay.until he say cardiologist tomorrow.

## 2015-04-17 NOTE — Progress Notes (Signed)
[facility] [place] at [facility]   PATIENT NAME: Ethan Renshawllen Nichter    MR#:  [id number]  DATE OF BIRTH:  1965-05-12  SUBJECTIVE:  CHIEF COMPLAINT:   Chief Complaint  Patient presents with  . Generalized Body Aches  . Diarrhea  . Fever    To 103.0 at home   Continues to have cough and poor appetite. No sputum. Fever is resolving, however, patient still does not feel well, admits of shortness of breath, especially whenever he lays down flat. Continues to have pleuritic chest pain. CT chest revealed moderate pericardial effusion, echocardiogram is pending. Patient is on colchicine, ibuprofen, Lasix at present. Continues to have significant wheezing, especially when he tries to lay down flat  REVIEW OF SYSTEMS:  CONSTITUTIONAL: no fever or weakness.  EYES: No blurred or double vision.  EARS, NOSE, AND THROAT: No tinnitus or ear pain.  RESPIRATORY: Has cough, mild shortness of breath, no wheezing or hemoptysis.  CARDIOVASCULAR: has chest pain while coughing, no orthopnea, edema.  GASTROINTESTINAL: no nausea, vomiting, no diarrhea or abdominal pain.  GENITOURINARY: No dysuria, hematuria.  ENDOCRINE: No polyuria, nocturia,  HEMATOLOGY: No anemia, easy bruising or bleeding SKIN: No rash or lesion. MUSCULOSKELETAL: No joint pain or arthritis.   NEUROLOGIC: No tingling, numbness, weakness.  PSYCHIATRY: No anxiety or depression.   DRUG ALLERGIES:  No Known Allergies  VITALS:  Blood pressure 127/80, pulse 88, temperature 98 F (36.7 C), temperature source Oral, resp. rate 18, height 5\' 9"  (1.753 m), weight 89.812 kg (198 lb), SpO2 95 %.  PHYSICAL EXAMINATION:  GENERAL:  50 y.o.-year-old patient lying in the bed with no acute distress.  EYES: Pupils equal, round, reactive to light and accommodation. No scleral icterus. Extraocular muscles intact.  HEENT: Head atraumatic, normocephalic. Oropharynx and nasopharynx clear.  NECK:  Supple, no jugular venous  distention. No thyroid enlargement, no tenderness.  LUNGS: Diminished breath sounds bilaterally, bilateral wheezing, also intermittent bilateral crackles. No use of accessory muscles of respiration.  CARDIOVASCULAR: S1, S2 distant. No murmurs, rubs, or gallops.  ABDOMEN: Soft, nontender, nondistended. Bowel sounds present. No organomegaly or mass.  EXTREMITIES: No pedal edema, cyanosis, or clubbing.  NEUROLOGIC: Cranial nerves II through XII are intact. Muscle strength 5/5 in all extremities. Sensation intact. Gait not checked.  PSYCHIATRIC: The patient is alert and oriented x 3.  SKIN: No obvious rash, lesion, or ulcer.    LABORATORY PANEL:   CBC  Recent Labs Lab 04/16/15 0456  WBC 6.4  HGB 11.8*  HCT 34.5*  PLT 471*   ------------------------------------------------------------------------------------------------------------------  Chemistries   Recent Labs Lab 04/11/15 0420 04/13/15 0415 04/17/15 0410  NA 133* 135  --   K 3.4* 3.9  --   CL 99* 99*  --   CO2 26 27  --   GLUCOSE 108* 137*  --   BUN 13 13  --   CREATININE 0.99 1.01 0.83  CALCIUM 7.6* 8.1*  --   MG 1.9  --   --    ------------------------------------------------------------------------------------------------------------------  Cardiac Enzymes  Recent Labs Lab 04/11/15 1035  TROPONINI 0.09*   ------------------------------------------------------------------------------------------------------------------  RADIOLOGY:  Ct Chest W Contrast  04/15/2015  CLINICAL DATA:  Pt. Was admitted to the hospital 1 week ago for flu like symptoms and pneumonia. Pt. Still having SOB, cough and fever. No hx of CA. EXAM: CT CHEST WITH CONTRAST TECHNIQUE: Multidetector CT imaging of the chest was performed during intravenous contrast administration. CONTRAST:  75mL OMNIPAQUE IOHEXOL 300 MG/ML  SOLN COMPARISON:  Chest x-ray 04/12/2015 FINDINGS: Heart: Moderate pericardial effusion is identified, measuring up to 1.8 cm  at the interventricular septum. No significant coronary artery calcification noted. The heart is normal in size. Vascular structures: Thoracic aorta is normal in appearance with only minimal atherosclerotic disease noted. The pulmonary arteries are grossly normal in appearance. Mediastinum/thyroid: The visualized portion of the thyroid gland has a normal appearance. Innumerable mediastinal lymph nodes are present. In the pretracheal region lymph node is 1.8 cm. AP window node 2.4 cm. Large number of sub cm lymph nodes in the anterior mediastinum, right peritracheal region, subcarinal region, and hilar regions. Azygo-esophageal recess node is 1.7 cm. Final lymph nodes are identified in the axillary regions bilaterally, left greater than right. Largest node in the left axillary region is 2.3 cm. Lungs/Airways: Small bilateral pleural effusions. Prominent reticular opacities primarily the periphery of the lung and at the lung bases, right greater than left. Opacities are confluent within the right lower lobe and associated with air bronchograms. Upper abdomen: Right renal cyst is present. No focal abnormality identified within the visualized portions of the liver, spleen, pancreas, or adrenal glands. Chest wall/osseous structures: Bilateral gynecomastia, left greater than right. Visualized osseous structures have a normal appearance. IMPRESSION: 1. Moderate pericardial effusion. 2. Significant adenopathy involving the mediastinum, hilar regions, and axillary regions. 3. Prominent reticular changes throughout the periphery of the lungs, especially involving the lower lobes, right greater than left. Considerations include usual interstitial pneumonia, drug reaction, lymphangitic carcinomatosis, pulmonary lymphoma, sarcoidosis, or collagen vascular disease. Post primary tuberculosis can have this appearance and should be excluded. 4. Given the significant adenopathy, malignancy remains a consideration. Electronically  Signed   By: Norva Pavlov M.D.   On: 04/15/2015 15:00    EKG:   Orders placed or performed during the hospital encounter of 04/10/15  . EKG 12-Lead  . EKG 12-Lead  . EKG 12-Lead  . EKG 12-Lead    ASSESSMENT AND PLAN:   # influenza and  bilateral pneumonitis with sepsis, now on levofloxacin and fever has subsided. Patient completed Tamiflu course - Nebulizers Fever has subsided.  CT scan of the chest was remarkable for lymphadenopathy,. Pericardial effusion, reticular changes in the lungs, possible interstitial pneumonia. Patient's blood cultures taken on March 11th showed no growth. Sputum culture is pending. Influenza test was negative .Further management and need for ID consultation depending on CT scan results.  #. Pericardial effusion, unclear etiology, likely cause of patient's chest pains and shortness of breath, continue colchicine, initiated on Advil round-the-clock, continue Lasix  # Acute respiratory failure with hypoxia.  Due to pneumonia has resolved, patient is off oxygen therapy now  # Diarrhea. Resolved.  # Hyponatremia and Hypokalemia - resolved with IV fluids  # Elevated troponin, possible due to demanding ischemia. Continue ASA and lipitor. Minimal elevation in stable trend. Echocardiogram as well as cardiology consultation are pending   All the records are reviewed and case discussed with Care Management/Social Workerr. Management plans discussed with the patient, his wife and they are in agreement.  CODE STATUS: Full code  TOTAL TIME TAKING CARE OF THIS PATIENT: 40 min Emotional support was provided POSSIBLE D/C IN 1-2 DAYS, DEPENDING ON CLINICAL CONDITION.   Katharina Caper M.D on 04/17/2015 at 1:24 PM  Between 7am to 6pm - Pager - [phone]  After 6pm go to [email or web address] - password [email or web address]  [facility]  Office  [phone]  CC: Primary care physician; Pcp Not In System

## 2015-04-18 DIAGNOSIS — J90 Pleural effusion, not elsewhere classified: Secondary | ICD-10-CM

## 2015-04-18 DIAGNOSIS — E876 Hypokalemia: Secondary | ICD-10-CM

## 2015-04-18 DIAGNOSIS — I309 Acute pericarditis, unspecified: Secondary | ICD-10-CM

## 2015-04-18 DIAGNOSIS — R197 Diarrhea, unspecified: Secondary | ICD-10-CM

## 2015-04-18 DIAGNOSIS — J1100 Influenza due to unidentified influenza virus with unspecified type of pneumonia: Secondary | ICD-10-CM

## 2015-04-18 DIAGNOSIS — R778 Other specified abnormalities of plasma proteins: Secondary | ICD-10-CM

## 2015-04-18 DIAGNOSIS — E871 Hypo-osmolality and hyponatremia: Secondary | ICD-10-CM

## 2015-04-18 DIAGNOSIS — R7989 Other specified abnormal findings of blood chemistry: Secondary | ICD-10-CM

## 2015-04-18 DIAGNOSIS — J9601 Acute respiratory failure with hypoxia: Secondary | ICD-10-CM

## 2015-04-18 MED ORDER — COLCHICINE 0.6 MG PO TABS: 0.6000 mg | ORAL_TABLET | Freq: Two times a day (BID) | ORAL | Status: AC

## 2015-04-18 MED ORDER — LEVOFLOXACIN 750 MG PO TABS: 750.0000 mg | ORAL_TABLET | Freq: Every day | ORAL | Status: AC

## 2015-04-18 MED ORDER — GUAIFENESIN 100 MG/5ML PO SOLN: 5.0000 mL | ORAL | Status: AC | PRN

## 2015-04-18 MED ORDER — IBUPROFEN 400 MG PO TABS: 400.0000 mg | ORAL_TABLET | ORAL | Status: AC | PRN

## 2015-04-18 NOTE — Progress Notes (Addendum)
Patient being discharged to home this morning. Discharge & RX instructions given and IV removed. Verbalized understanding. Wife packed belongings and here ready to take him home.

## 2015-04-18 NOTE — Discharge Summary (Signed)
[facility] [place] [facility]   PATIENT NAME: Ethan Warner    MR#:  [id number]  DATE OF BIRTH:  1965-03-28  DATE OF ADMISSION:  04/10/2015 ADMITTING PHYSICIAN: Shaune PollackQing Chen, MD  DATE OF DISCHARGE: 04/18/2015  9:39 AM  PRIMARY CARE PHYSICIAN: Pcp Not In System     ADMISSION DIAGNOSIS:  Bilateral pneumonia [J18.9]  DISCHARGE DIAGNOSIS:  Principal Problem:   Pneumonia Active Problems:   Acute respiratory failure with hypoxia (HCC)   Influenza with pneumonia   Acute pericarditis   Pleural effusion, left   Elevated troponin   Hyponatremia   Hypokalemia   Diarrhea   SECONDARY DIAGNOSIS:  History reviewed. No pertinent past medical history.  .pro HOSPITAL COURSE:   The patient is a 50 year old African-American male with no significant past medical history who presents to the hospital with complaints of fever, cough, chills, shortness of breath for the past 5 days. He was also complaining of generalized body aches, nausea, vomiting and diarrhea. His initial chest x-ray revealed increasing bibasilar infiltrates and patient was admitted to the hospital for further evaluation and treatment. Patient was initiated on Zithromax and Rocephin and was admitted to the hospital for therapy. The patient's initial labs revealed low sodium level at 133. Elevated troponin of 0.07 on the first set 0.08 on the second 0.09 on the third. CBC was unremarkable. Patient had influenza tested and it was positive for influenza A by PCR. He was initiated on Tamiflu and his condition improved. However, he was still complaining of some chest pains and shortness of breath, especially whenever he laid down flat. Because of mildly elevated troponin. Cardiology consultation was requested and cardiologist, felt that patient likely had acute pericarditis. In view of pericardial effusion. Patient was initiated on colchicine, Advil, and his chest pain resolved. Patient was advised to continue  antibiotic therapy with levofloxacin to complete course, cough medications colchicine, ibuprofen and follow-up with primary care physician and cardiologist as outpatient. Of note, patient had CT of the chest with contrast done revealing multiple abnormalities including adenopathy in the mid sternal hilar regions and axillary regions comment critical or changes throughout periphery of the lungs, involving lower lobes, right greater than left, concerning for interstitial pneumonia, drug reaction, lymphangitic carcinomatosis, pulmonary, lymphoma, sarcoidosis, vascular disease, malignancy was of concern. Because of abnormal CT scan of the chest consultation of this pulmonologist was also obtained. The pulmonologist recommended to continue antibiotic therapy for now and follow-up with him as outpatient to repeat CT scan in 2 months as outpatient, he recommended no steroids. Patient was also seen by Dr. Sampson GoonFitzgerald, infectious disease specialist and recommended antibiotic therapy.  Discussion by problem #1, influenza A by PCR and bilateral pneumonitis with sepsis, patient was advised to continue levofloxacin, patient completed Tamiflu course while in the hospital. The patient's fever has subsided. Patient's CT scan of the chest was remarkable for lymphadenopathy pericardial effusion. Critical changes in the lungs. Suspected interstitial pneumonia. Patient's blood cultures taken on March 11 showed no growth, sputum culture was pending by the day of discharge, or canceled.  #2. Pericardial effusion, likely etiology is acute pericarditis per cardiologist, discussed with Dr. Juliann Paresallwood, patient is to continue colchicine, Advil, improved clinically area. The patient is to follow up with cardiologist as outpatient. #3. Acute respiratory failure with hypoxia, due to pneumonia, influenza, patient was weaned off oxygen by the day of discharge. #4. Diarrhea, resolved, C. difficile study was canceled. #5 elevated troponin, likely  demand ischemia, not acute coronary syndrome  per cardiologist, no need for aspirin and Lipitor, patient is to follow-up with cardiologist as outpatient for further recommendations DISCHARGE CONDITIONS:   Stable  CONSULTS OBTAINED:  Treatment Team:  Lamar Blinks, MD Clydie Braun, MD Alwyn Pea, MD  DRUG ALLERGIES:  No Known Allergies  DISCHARGE MEDICATIONS:   Discharge Medication List as of 04/18/2015  9:11 AM    START taking these medications   Details  colchicine 0.6 MG tablet Take 1 tablet (0.6 mg total) by mouth 2 (two) times daily., Starting 04/18/2015, Until Discontinued, Normal    guaiFENesin (ROBITUSSIN) 100 MG/5ML SOLN Take 5 mLs (100 mg total) by mouth every 4 (four) hours as needed for cough or to loosen phlegm., Starting 04/18/2015, Until Discontinued, Normal    ibuprofen (ADVIL,MOTRIN) 400 MG tablet Take 1 tablet (400 mg total) by mouth every 4 (four) hours as needed., Starting 04/18/2015, Until Discontinued, Normal    levofloxacin (LEVAQUIN) 750 MG tablet Take 1 tablet (750 mg total) by mouth daily., Starting 04/18/2015, Until Discontinued, Normal         DISCHARGE INSTRUCTIONS:    Patient is to follow up with cardiologist, pulmonologist as outpatient  If you experience worsening of your admission symptoms, develop shortness of breath, life threatening emergency, suicidal or homicidal thoughts you must seek medical attention immediately by calling 911 or calling your MD immediately  if symptoms less severe.  You Must read complete instructions/literature along with all the possible adverse reactions/side effects for all the Medicines you take and that have been prescribed to you. Take any new Medicines after you have completely understood and accept all the possible adverse reactions/side effects.   Please note  You were cared for by a hospitalist during your hospital stay. If you have any questions about your discharge medications or the care you  received while you were in the hospital after you are discharged, you can call the unit and asked to speak with the hospitalist on call if the hospitalist that took care of you is not available. Once you are discharged, your primary care physician will handle any further medical issues. Please note that NO REFILLS for any discharge medications will be authorized once you are discharged, as it is imperative that you return to your primary care physician (or establish a relationship with a primary care physician if you do not have one) for your aftercare needs so that they can reassess your need for medications and monitor your lab values.    Today   CHIEF COMPLAINT:   Chief Complaint  Patient presents with  . Generalized Body Aches  . Diarrhea  . Fever    To 103.0 at home    HISTORY OF PRESENT ILLNESS:  Dyshon Ethan Warner  is a 50 y.o. male with no significant past medical history who presents to the hospital with complaints of fever, cough, chills, shortness of breath for the past 5 days. He was also complaining of generalized body aches, nausea, vomiting and diarrhea. His initial chest x-ray revealed increasing bibasilar infiltrates and patient was admitted to the hospital for further evaluation and treatment. Patient was initiated on Zithromax and Rocephin and was admitted to the hospital for therapy. The patient's initial labs revealed low sodium level at 133. Elevated troponin of 0.07 on the first set 0.08 on the second 0.09 on the third. CBC was unremarkable. Patient had influenza tested and it was positive for influenza A by PCR. He was initiated on Tamiflu and his condition improved. However, he was  still complaining of some chest pains and shortness of breath, especially whenever he laid down flat. Because of mildly elevated troponin. Cardiology consultation was requested and cardiologist, felt that patient likely had acute pericarditis. In view of pericardial effusion. Patient was initiated on  colchicine, Advil, and his chest pain resolved. Patient was advised to continue antibiotic therapy with levofloxacin to complete course, cough medications colchicine, ibuprofen and follow-up with primary care physician and cardiologist as outpatient. Of note, patient had CT of the chest with contrast done revealing multiple abnormalities including adenopathy in the mid sternal hilar regions and axillary regions comment critical or changes throughout periphery of the lungs, involving lower lobes, right greater than left, concerning for interstitial pneumonia, drug reaction, lymphangitic carcinomatosis, pulmonary, lymphoma, sarcoidosis, vascular disease, malignancy was of concern. Because of abnormal CT scan of the chest consultation of this pulmonologist was also obtained. The pulmonologist recommended to continue antibiotic therapy for now and follow-up with him as outpatient to repeat CT scan in 2 months as outpatient, he recommended no steroids. Patient was also seen by Dr. Sampson Goon, infectious disease specialist and recommended antibiotic therapy.  Discussion by problem #1, influenza A by PCR and bilateral pneumonitis with sepsis, patient was advised to continue levofloxacin, patient completed Tamiflu course while in the hospital. The patient's fever has subsided. Patient's CT scan of the chest was remarkable for lymphadenopathy pericardial effusion. Critical changes in the lungs. Suspected interstitial pneumonia. Patient's blood cultures taken on March 11 showed no growth, sputum culture was pending by the day of discharge, or canceled.  #2. Pericardial effusion, likely etiology is acute pericarditis per cardiologist, discussed with Dr. Juliann Pares, patient is to continue colchicine, Advil, improved clinically area. The patient is to follow up with cardiologist as outpatient. #3. Acute respiratory failure with hypoxia, due to pneumonia, influenza, patient was weaned off oxygen by the day of discharge. #4.  Diarrhea, resolved, C. difficile study was canceled. #5 elevated troponin, likely demand ischemia, not acute coronary syndrome per cardiologist, no need for aspirin and Lipitor, patient is to follow-up with cardiologist as outpatient for further recommendations DISCHARGE CONDITIONS:     VITAL SIGNS:  Blood pressure 119/79, pulse 103, temperature 98.7 F (37.1 C), temperature source Oral, resp. rate 18, height  (1.753 m), weight 89.812 kg (198 lb), SpO2 96 %.  I/O:  No intake or output data in the 24 hours ending 04/18/15 1444  PHYSICAL EXAMINATION:  GENERAL:  50 y.o.-year-old patient lying in the bed with no acute distress.  EYES: Pupils equal, round, reactive to light and accommodation. No scleral icterus. Extraocular muscles intact.  HEENT: Head atraumatic, normocephalic. Oropharynx and nasopharynx clear.  NECK:  Supple, no jugular venous distention. No thyroid enlargement, no tenderness.  LUNGS: Normal breath sounds bilaterally, no wheezing, rales,rhonchi or crepitation. No use of accessory muscles of respiration.  CARDIOVASCULAR: S1, S2 normal. No murmurs, rubs, or gallops.  ABDOMEN: Soft, non-tender, non-distended. Bowel sounds present. No organomegaly or mass.  EXTREMITIES: No pedal edema, cyanosis, or clubbing.  NEUROLOGIC: Cranial nerves II through XII are intact. Muscle strength 5/5 in all extremities. Sensation intact. Gait not checked.  PSYCHIATRIC: The patient is alert and oriented x 3.  SKIN: No obvious rash, lesion, or ulcer.   DATA REVIEW:   CBC  Recent Labs Lab 04/16/15 0456  WBC 6.4  HGB 11.8*  HCT 34.5*  PLT 471*    Chemistries   Recent Labs Lab 04/13/15 0415 04/17/15 0410  NA 135  --   K 3.9  --  CL 99*  --   CO2 27  --   GLUCOSE 137*  --   BUN 13  --   CREATININE 1.01 0.83  CALCIUM 8.1*  --     Cardiac Enzymes No results for input(s): TROPONINI in the last 168 hours.  Microbiology Results  Results for orders placed or performed  during the hospital encounter of 04/10/15  Rapid Influenza A&B Antigens ([facility] only)     Status: None   Collection Time: 04/10/15  6:36 AM  Result Value Ref Range Status   Influenza A ([facility]) NEGATIVE NEGATIVE Final   Influenza B ([facility]) NEGATIVE NEGATIVE Final  Blood culture (routine x 2)     Status: None   Collection Time: 04/10/15  7:31 AM  Result Value Ref Range Status   Specimen Description BLOOD RIGHT FOREARM  Final   Special Requests BOTTLES DRAWN AEROBIC AND ANAEROBIC 1CCAERO,1CCANA  Final   Culture NO GROWTH 5 DAYS  Final   Report Status 04/15/2015 FINAL  Final  Blood culture (routine x 2)     Status: None   Collection Time: 04/10/15  7:57 AM  Result Value Ref Range Status   Specimen Description BLOOD LEFT FOREARM  Final   Special Requests BOTTLES DRAWN AEROBIC AND ANAEROBIC 1CCAERO,1CCANA  Final   Culture NO GROWTH 5 DAYS  Final   Report Status 04/15/2015 FINAL  Final    RADIOLOGY:  No results found.  EKG:   Orders placed or performed during the hospital encounter of 04/10/15  . EKG 12-Lead  . EKG 12-Lead  . EKG 12-Lead  . EKG 12-Lead      Management plans discussed with the patient, family and they are in agreement.  CODE STATUS:  Code Status History    Date Active Date Inactive Code Status Order ID Comments User Context   04/10/2015  9:05 AM 04/18/2015 12:40 PM Full Code [id number]  Shaune Pollack, MD Inpatient      TOTAL TIME TAKING CARE OF THIS PATIENT: 40 minutes.    Katharina Caper M.D on 04/18/2015 at 2:44 PM  Between 7am to 6pm - Pager - [phone]  After 6pm go to [email or web address] - password [email or web address]  [facility]  Office  [phone]  CC: Primary care physician; Pcp Not In System

## 2015-04-19 ENCOUNTER — Encounter: Payer: Self-pay | Admitting: Infectious Diseases

## 2015-04-19 ENCOUNTER — Telehealth: Payer: Self-pay | Admitting: *Deleted

## 2015-04-19 NOTE — Telephone Encounter (Signed)
-----   Message from Stephanie AcreVishal Mungal, MD sent at 04/16/2015  3:05 PM EDT ----- Regarding: HFU Please schedule this patient to follow with me or any open provider in 2-3 weeks.  Hospital follow up - influenza A, abnormal CT scan

## 2015-04-19 NOTE — Telephone Encounter (Signed)
Tried to call pt to schedule hosp f/u. Number has been disconnected and no other number available in chart.

## 2015-04-20 NOTE — Telephone Encounter (Signed)
We can try his email address. Otherwise we will have to wait until he calls us.  I gave him my card with contact info also.   -VM

## 2015-04-21 ENCOUNTER — Telehealth: Payer: Self-pay | Admitting: Internal Medicine

## 2015-04-21 NOTE — Telephone Encounter (Signed)
Patient needs a follow up appt with Dr. Dema SeverinMungal in one week post hospital. Please call spouse.

## 2015-04-21 NOTE — Telephone Encounter (Signed)
Sent pt an email requesting he give us a call to schedule his f/u appt. Nothing further needed at this time.

## 2015-04-22 NOTE — Telephone Encounter (Signed)
Tried calling the number under contacts 2 times and the number is busy. The number in chart is disconnected.

## 2015-04-23 NOTE — Telephone Encounter (Signed)
appt scheduled with wife. Nothing further needed.

## 2015-04-23 NOTE — Telephone Encounter (Signed)
Pt wife calling stating she never got a call back from us I stated to her we did try and call 2x and the number is not working  She said "that the nurse is telling a tale" that they are home and did not leave. They have caller ID she was a bit upset that we lied about calling.  Please call back.  I did forward her to Misty's vm

## 2015-04-27 ENCOUNTER — Inpatient Hospital Stay: Payer: PRIVATE HEALTH INSURANCE | Admitting: Pulmonary Disease

## 2015-06-01 ENCOUNTER — Inpatient Hospital Stay: Payer: PRIVATE HEALTH INSURANCE | Admitting: Pulmonary Disease

## 2016-09-23 IMAGING — CR DG CHEST 2V
2 series · 2 of 2 positions shown · non-contrast
Comparison: 04/10/2015

CLINICAL DATA: Fever and cough

EXAM:
CHEST  2 VIEW

[chest pa]
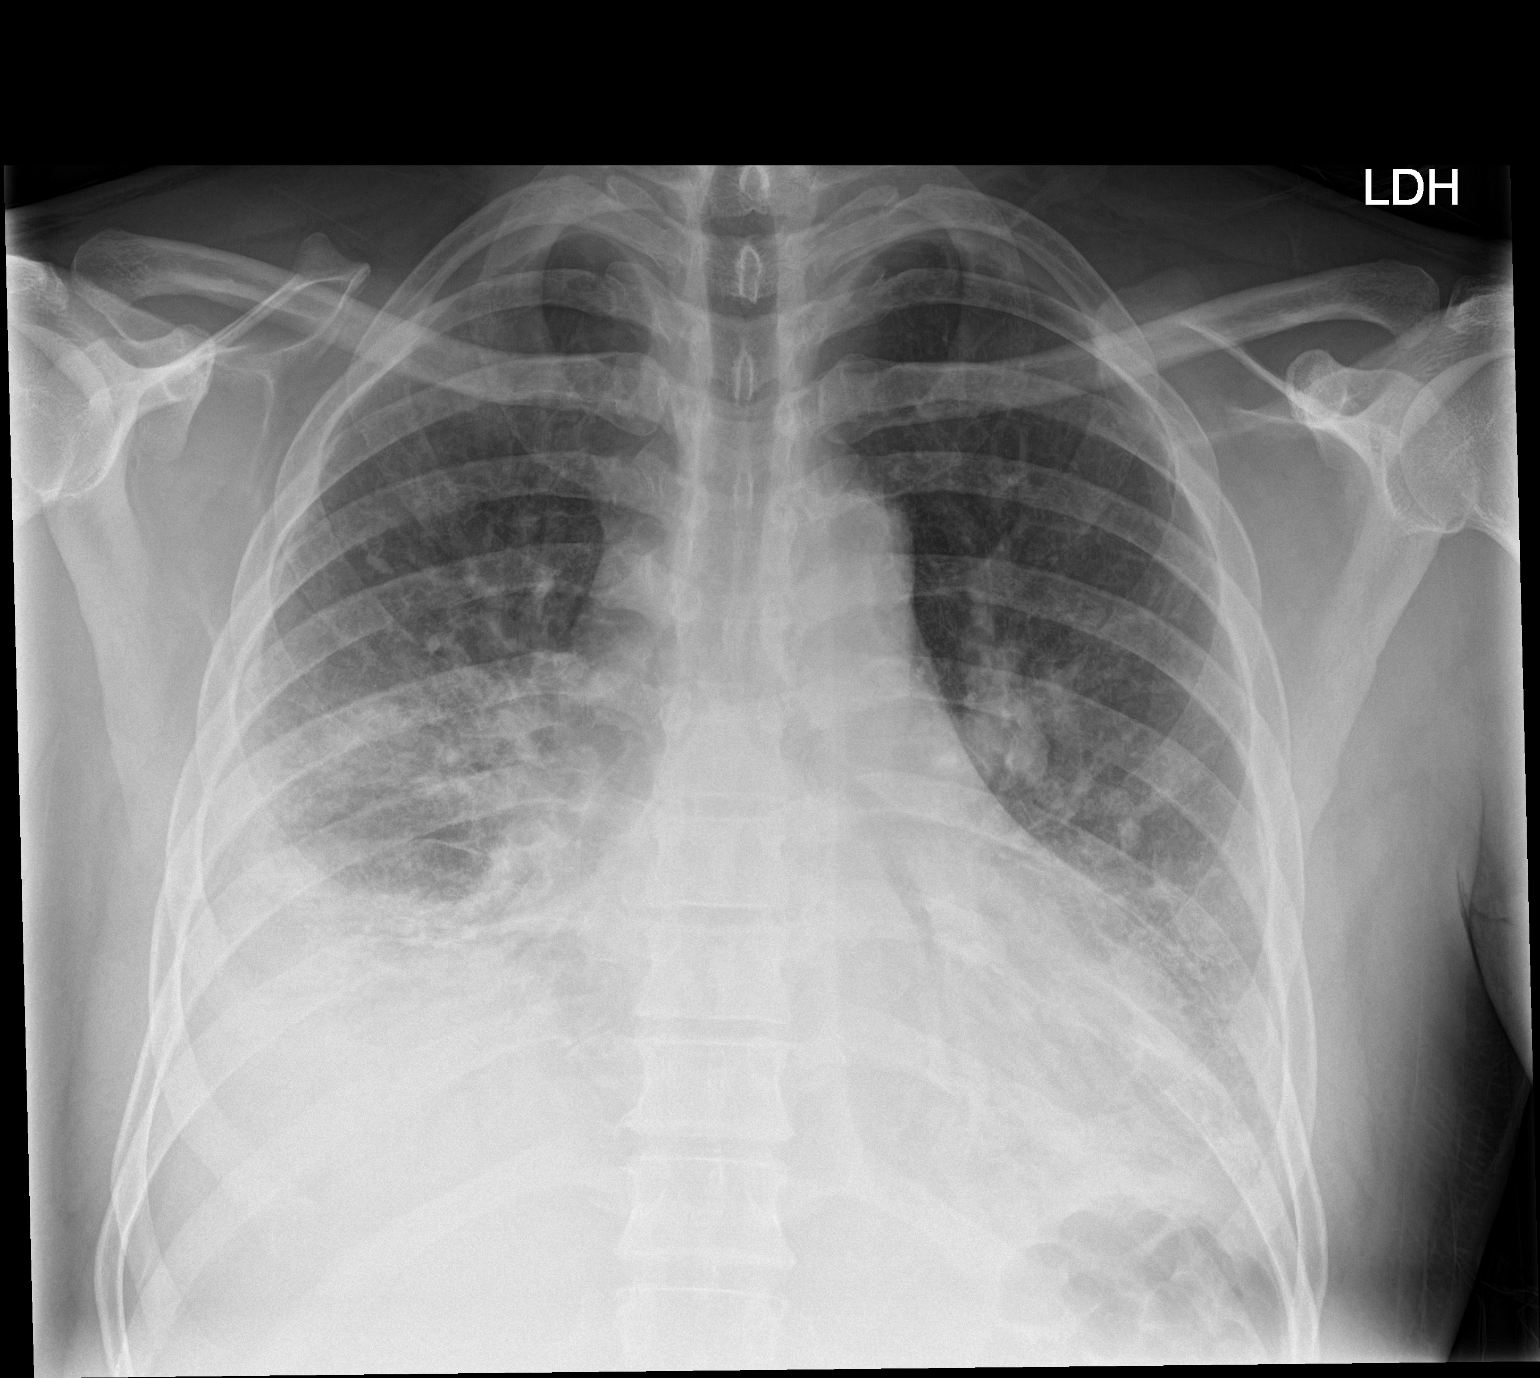

[chest lat]
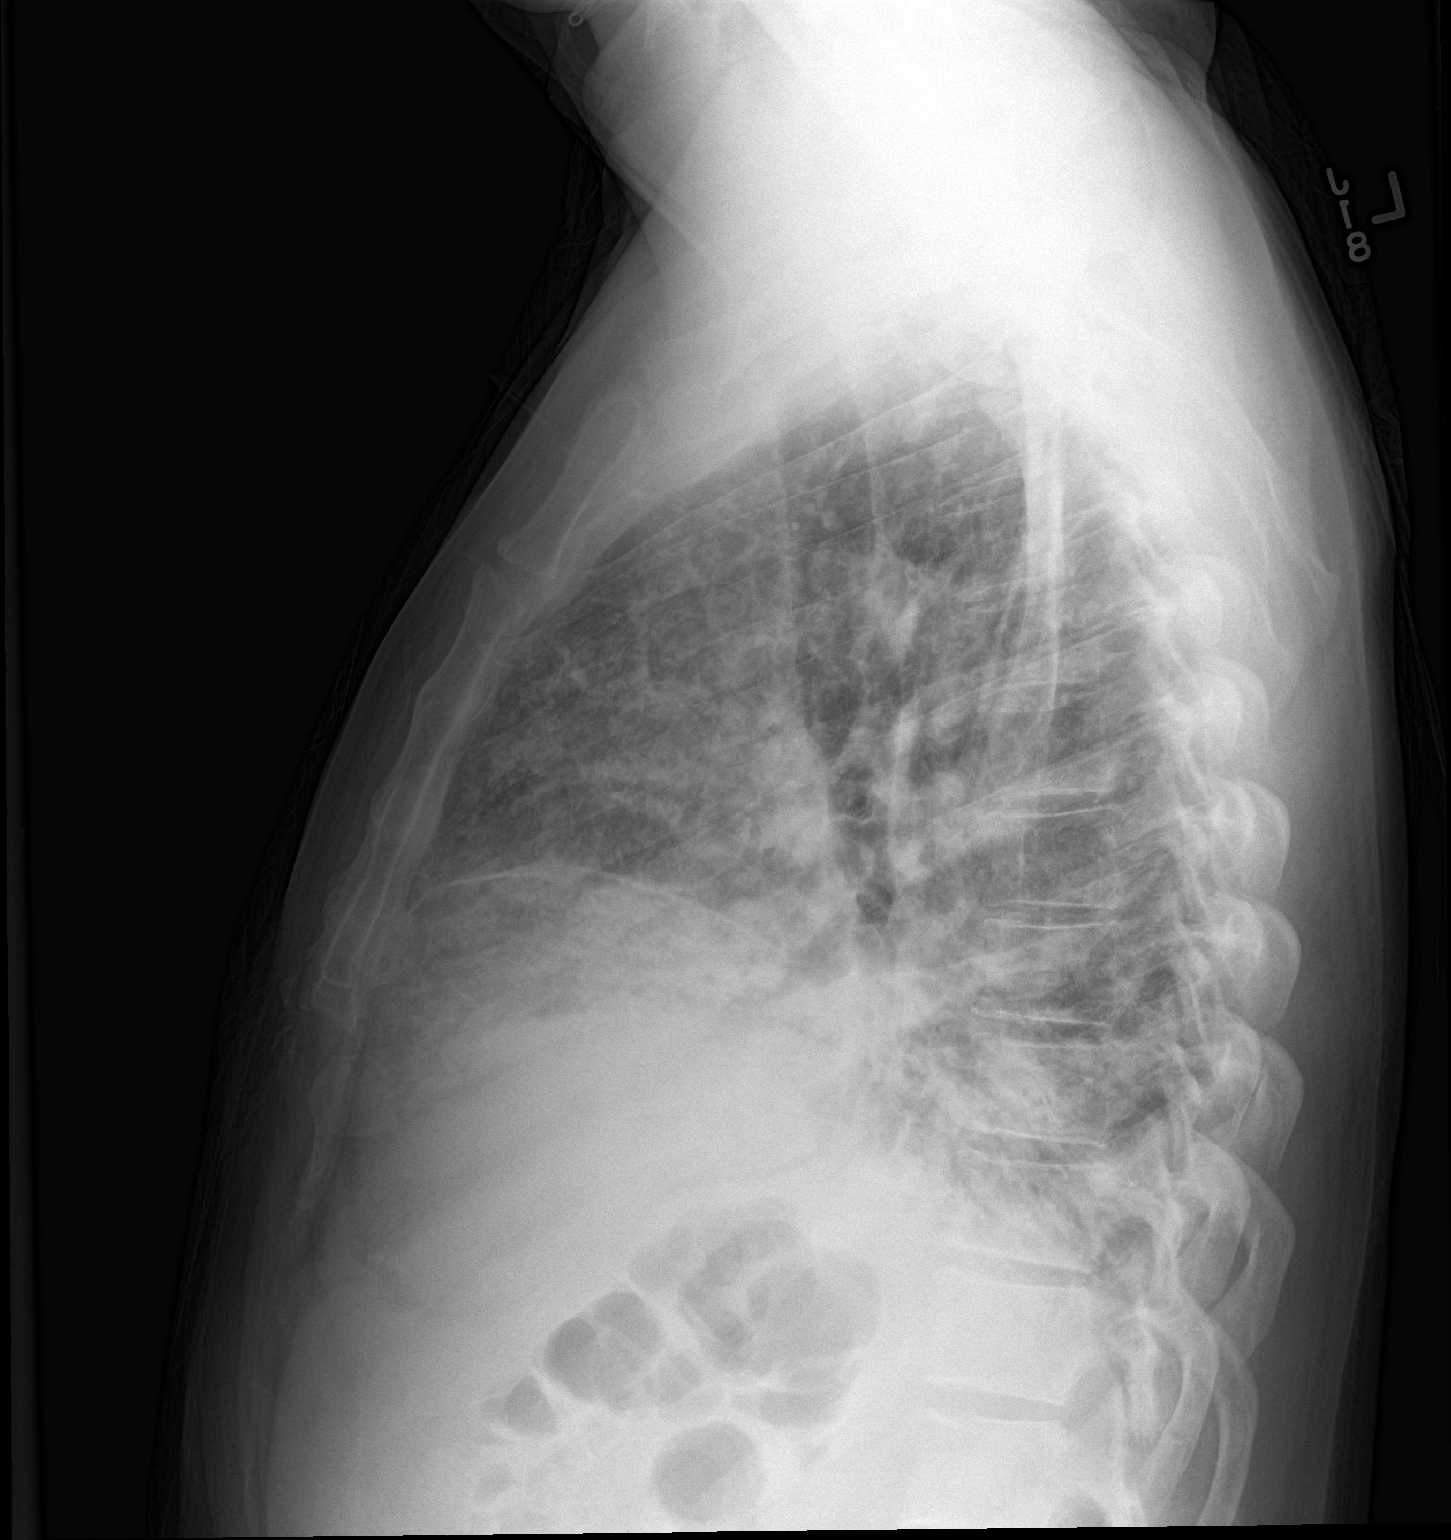

[2 of 2 positions shown; findings below may reference images not displayed]

FINDINGS: Cardiac shadow is stable. Increased bibasilar infiltrates are noted
with increasing right-sided pleural effusion. No bony abnormality is
seen.
IMPRESSION: Increasing bibasilar infiltrates.

## 2016-09-26 IMAGING — CT CT CHEST W/ CM
2 of 3 series · 16 of 46 positions shown, 18 images · IV contrast (omnipaque)
Comparison: Chest x-ray 04/12/2015

CLINICAL DATA: Pt. Was admitted to the hospital 1 week ago for flu
like symptoms and pneumonia. Pt. Still having SOB, cough and fever.
No hx of CA.

EXAM:
CT CHEST WITH CONTRAST
TECHNIQUE: Multidetector CT imaging of the chest was performed during
intravenous contrast administration.
CONTRAST:  75mL OMNIPAQUE IOHEXOL 300 MG/ML  SOLN

[Series 2: routine chest with · axial · 0.72mm/px · z∈[-81,+194]mm · 13 of 65 slices shown, 15 images]
[im 5/65  soft-tissue]
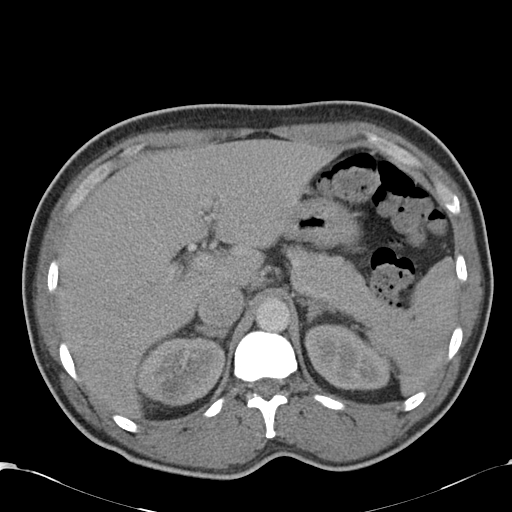
[im 5/65  bone]
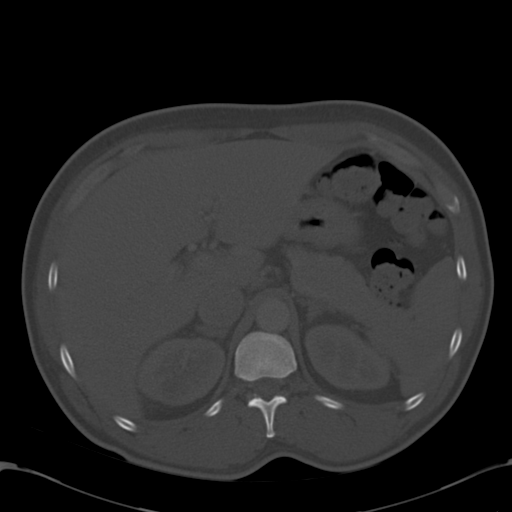
[im 9/65  soft-tissue]
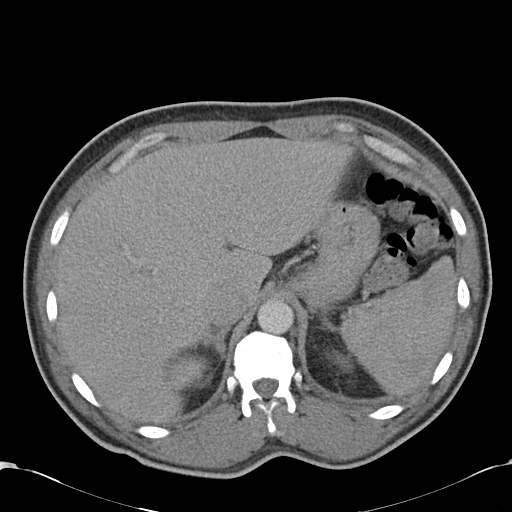
[im 13/65  soft-tissue]
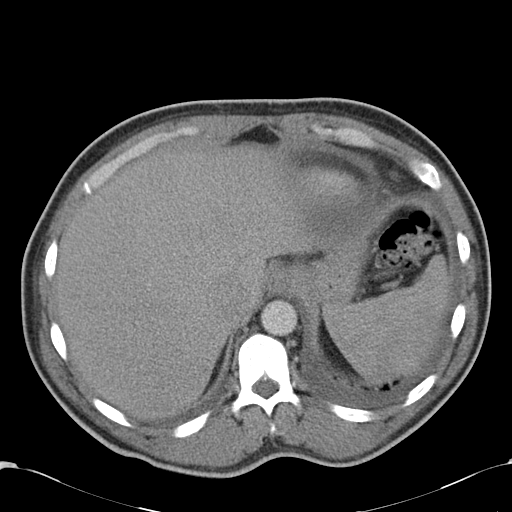
[im 19/65  soft-tissue]
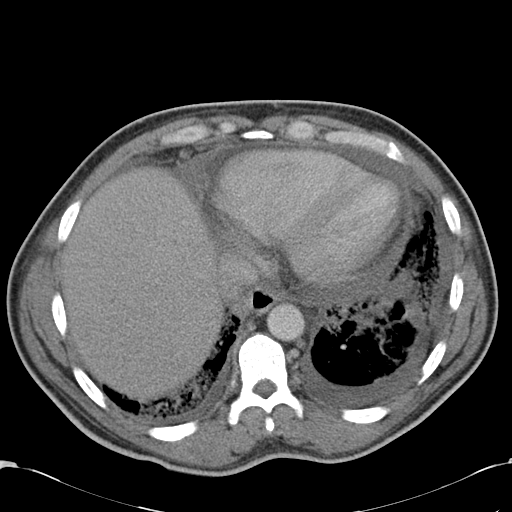
[im 23/65  soft-tissue]
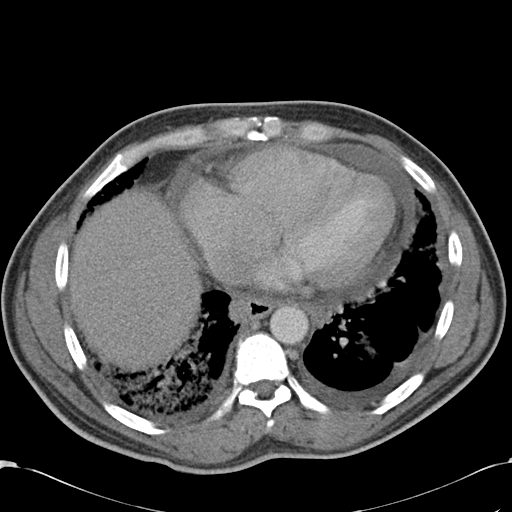
[im 27/65  soft-tissue]
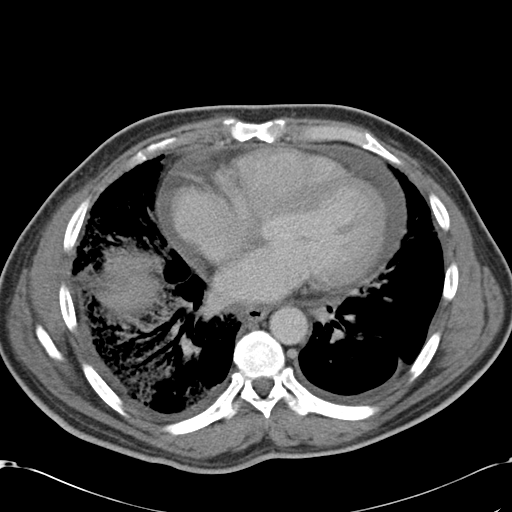
[im 34/65  soft-tissue]
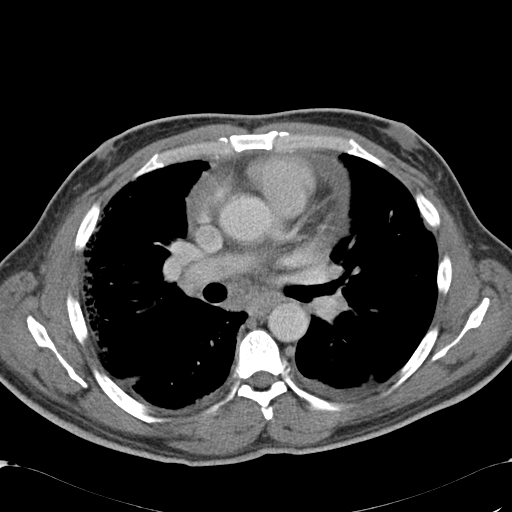
[im 38/65  soft-tissue]
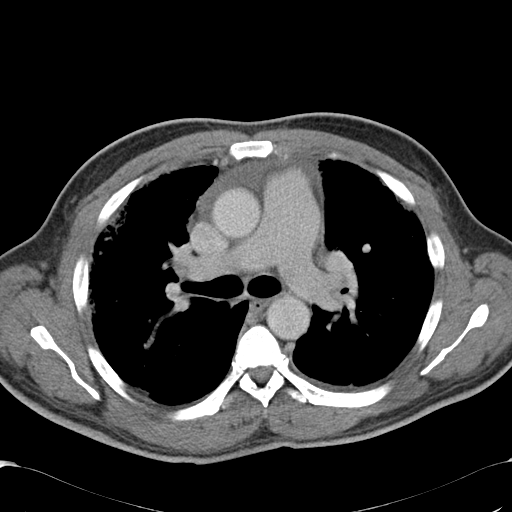
[im 42/65  soft-tissue]
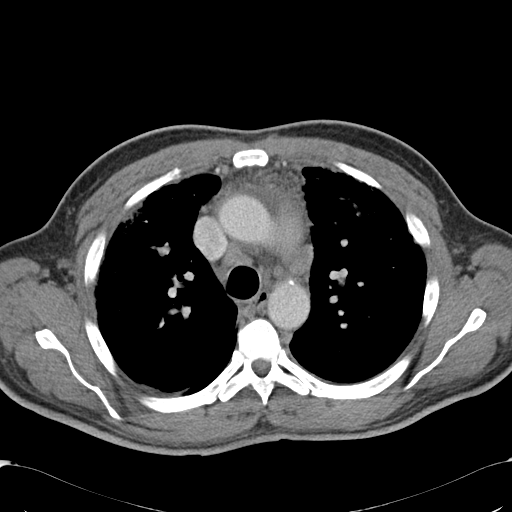
[im 42/65  bone]
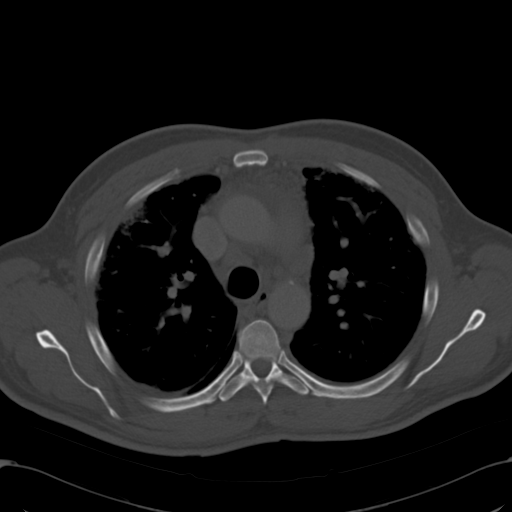
[im 46/65  soft-tissue]
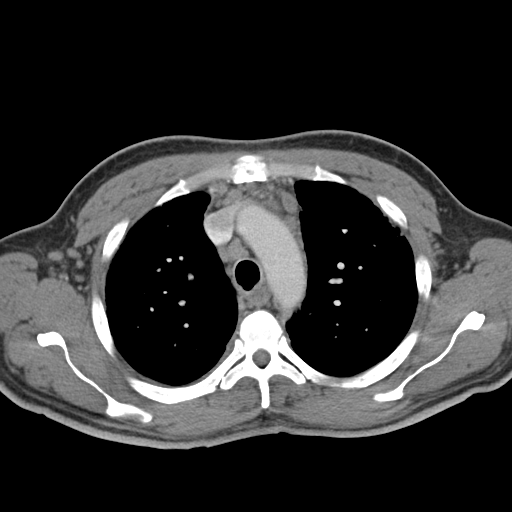
[im 52/65  soft-tissue]
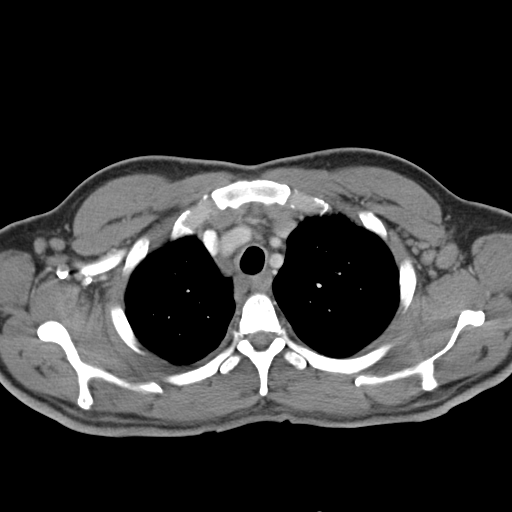
[im 56/65  soft-tissue]
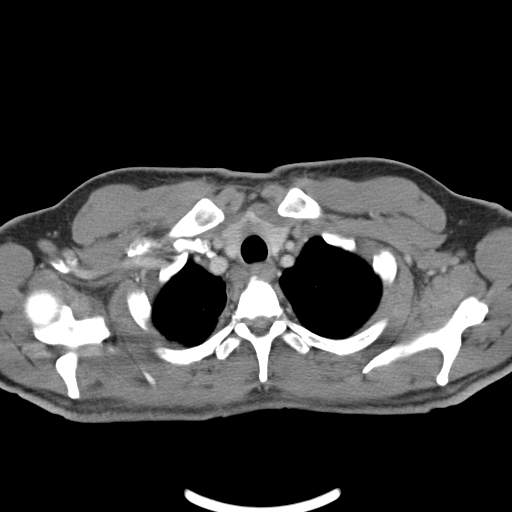
[im 60/65  soft-tissue]
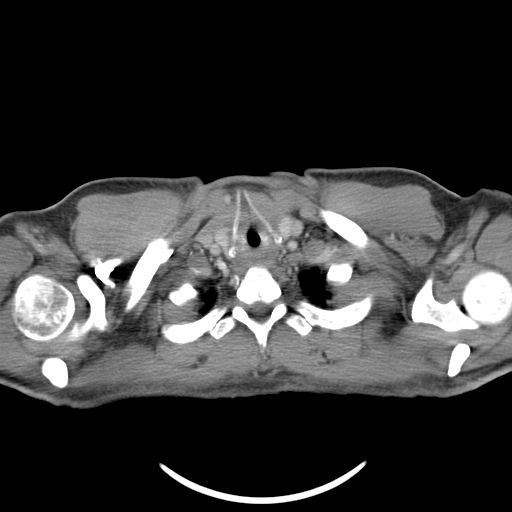

[Series 5: routine chest with cor · coronal · 0.64mm/px · 3 of 129 slices shown]
[im 43/129  soft-tissue]
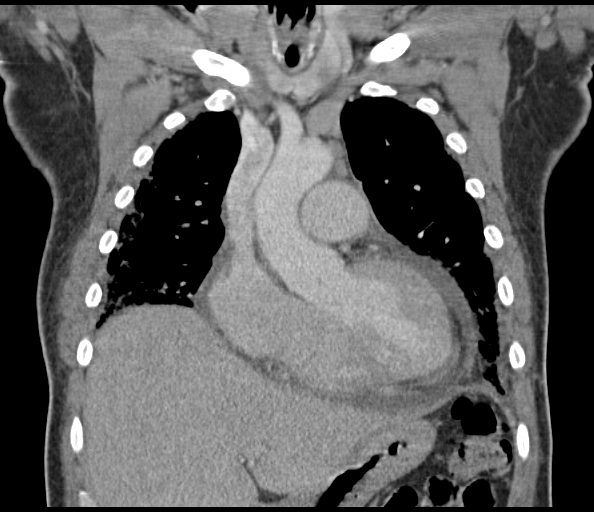
[im 57/129  soft-tissue]
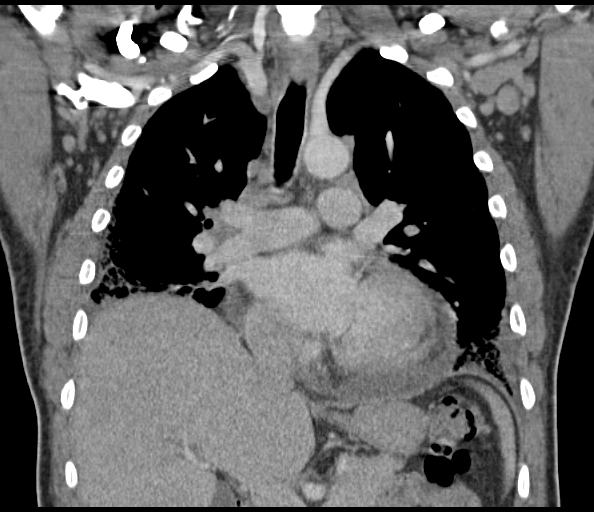
[im 72/129  soft-tissue]
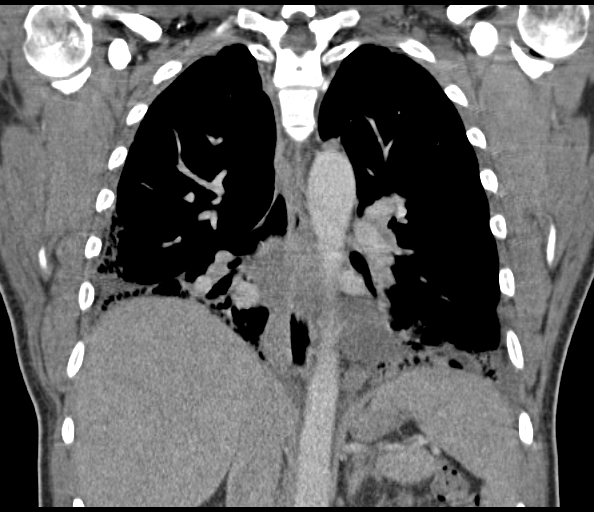

[16 of 46 positions shown; findings below may reference images not displayed]

FINDINGS: Heart: Moderate pericardial effusion is identified, measuring up to
1.8 cm at the interventricular septum. No significant coronary
artery calcification noted. The heart is normal in size.

Vascular structures: Thoracic aorta is normal in appearance with
only minimal atherosclerotic disease noted. The pulmonary arteries
are grossly normal in appearance.

Mediastinum/thyroid: The visualized portion of the thyroid gland has
a normal appearance.

Innumerable mediastinal lymph nodes are present. In the pretracheal
region lymph node is 1.8 cm. AP window node 2.4 cm. Large number of
sub cm lymph nodes in the anterior mediastinum, right peritracheal
region, subcarinal region, and hilar regions. Azygo-esophageal
recess node is 1.7 cm.

Final lymph nodes are identified in the axillary regions
bilaterally, left greater than right. Largest node in the left
axillary region is 2.3 cm.

Lungs/Airways: Small bilateral pleural effusions. Prominent
reticular opacities primarily the periphery of the lung and at the
lung bases, right greater than left. Opacities are confluent within
the right lower lobe and associated with air bronchograms.

Upper abdomen: Right renal cyst is present. No focal abnormality
identified within the visualized portions of the liver, spleen,
pancreas, or adrenal glands.

Chest wall/osseous structures: Bilateral gynecomastia, left greater
than right. Visualized osseous structures have a normal appearance.
IMPRESSION: 1. Moderate pericardial effusion.
2. Significant adenopathy involving the mediastinum, hilar regions,
and axillary regions.
3. Prominent reticular changes throughout the periphery of the
lungs, especially involving the lower lobes, right greater than
left. Considerations include usual interstitial pneumonia, drug
reaction, lymphangitic carcinomatosis, pulmonary lymphoma,
sarcoidosis, or collagen vascular disease. Post primary tuberculosis
can have this appearance and should be excluded.
4. Given the significant adenopathy, malignancy remains a
consideration.

## 2021-12-01 ENCOUNTER — Emergency Department: Payer: PRIVATE HEALTH INSURANCE

## 2021-12-01 ENCOUNTER — Encounter: Payer: Self-pay | Admitting: *Deleted

## 2021-12-01 ENCOUNTER — Other Ambulatory Visit: Payer: Self-pay

## 2021-12-01 ENCOUNTER — Emergency Department
Admission: EM | Admit: 2021-12-01 | Discharge: 2021-12-01 | Disposition: A | Discharge status: Home / Self Care | Payer: PRIVATE HEALTH INSURANCE | Attending: Emergency Medicine | Admitting: Emergency Medicine

## 2021-12-01 DIAGNOSIS — S91311A Laceration without foreign body, right foot, initial encounter: Secondary | ICD-10-CM | POA: Insufficient documentation

## 2021-12-01 DIAGNOSIS — Z23 Encounter for immunization: Secondary | ICD-10-CM | POA: Insufficient documentation

## 2021-12-01 DIAGNOSIS — W208XXA Other cause of strike by thrown, projected or falling object, initial encounter: Secondary | ICD-10-CM | POA: Insufficient documentation

## 2021-12-01 MED ORDER — LIDOCAINE HCL (PF) 1 % IJ SOLN
5.0000 mL | Freq: Once | INTRAMUSCULAR | Status: AC
  Administered 2021-12-01: 5 mL
  Filled 2021-12-01: qty 5

## 2021-12-01 MED ORDER — TETANUS-DIPHTH-ACELL PERTUSSIS 5-2.5-18.5 LF-MCG/0.5 IM SUSY
0.5000 mL | PREFILLED_SYRINGE | Freq: Once | INTRAMUSCULAR | Status: AC
  Administered 2021-12-01: 0.5 mL via INTRAMUSCULAR
  Filled 2021-12-01: qty 0.5

## 2021-12-01 NOTE — Discharge Instructions (Signed)
Keep the wound clean, dry, and covered.  See your provider or local urgent care in 10 to 12 days for suture removal.

## 2021-12-01 NOTE — ED Notes (Signed)
ED Provider at bedside. 

## 2021-12-01 NOTE — ED Provider Triage Note (Signed)
Emergency Medicine Provider Triage Evaluation Note  Ethan Warner, a 56 y.o. male  was evaluated in triage.  Pt complains of right foot laceration. He accidentally cut the medial MTP with a circular saw.   Review of Systems  Positive: Right Foot lac Negative: CP, SOB  Physical Exam  There were no vitals taken for this visit. Gen:   Awake, no distress  NAD Resp:  Normal effort CTA MSK:   Moves extremities without difficulty Right foot with a linear laceration over the lateral 1st  MTP Other:    Medical Decision Making  Medically screening exam initiated at 5:35 PM.  Appropriate orders placed.  Ethan Warner was informed that the remainder of the evaluation will be completed by another provider, this initial triage assessment does not replace that evaluation, and the importance of remaining in the ED until their evaluation is complete.  Patient to the ED for evaluation of an accidental laceration to the right foot.   Ethan Needles, PA-C 12/01/21 1738

## 2021-12-01 NOTE — ED Triage Notes (Signed)
Pt dropped a circular saw onto right foot.  Laceration to foot  bleeding controlled.  Pt was wearing shoes when injury occurred.  Pt alert.

## 2021-12-08 NOTE — ED Provider Notes (Signed)
[facility] Emergency Department Provider Note     Event Date/Time   First MD Initiated Contact with Patient 12/01/21 2016     (approximate)   History   Foot Injury and Laceration   HPI  Ethan Warner is a 56 y.o. male presents to the ED for evaluation of accidental laceration to the right foot.  Patient presents with a shoe in place, after he excellently dropped a circular saw on top of his foot.  He presents with a laceration to the dorsal lateral aspect of the first MTP.  Normal gross sensation is reported.  No other injury reported.  He is reporting an out date tetanus status.   Physical Exam   Triage Vital Signs: ED Triage Vitals [12/01/21 1736]  Enc Vitals Group     BP (!) 144/87     Pulse Rate 76     Resp 18     Temp 98.5 F (36.9 C)     Temp Source Oral     SpO2 96 %     Weight 195 lb (88.5 kg)     Height 5\' 9"  (1.753 m)     Head Circumference      Peak Flow      Pain Score 0     Pain Loc      Pain Edu?      Excl. in GC?     Most recent vital signs: Vitals:   12/01/21 1736 12/01/21 2239  BP: (!) 144/87 138/81  Pulse: 76 71  Resp: 18 18  Temp: 98.5 F (36.9 C)   SpO2: 96% 98%    General Awake, no distress. NAD CV:  Good peripheral perfusion.  RESP:  Normal effort.  ABD:  No distention.  SKIN:  Right foot with linear laceration over the lateral MTPl. Normal toe ROM. No nail avulusion   ED Results / Procedures / Treatments   Labs (all labs ordered are listed, but only abnormal results are displayed) Labs Reviewed - No data to display   EKG   RADIOLOGY  I personally viewed and evaluated these images as part of my medical decision making, as well as reviewing the written report by the radiologist.  ED Provider Interpretation: no acute findings   DG Right Foot:  IMPRESSION: No acute fracture or dislocation.   PROCEDURES:  Critical Care performed: No  ..Laceration Repair  Date/Time: 12/01/2021 10:52  PM  Performed by: 13/03/2021, PA-C Authorized by: Lissa Hoard, PA-C   Consent:    Consent obtained:  Verbal   Consent given by:  Patient   Risks, benefits, and alternatives were discussed: yes     Risks discussed:  Infection, pain and poor wound healing Universal protocol:    Imaging studies available: yes     Site/side marked: yes     Patient identity confirmed:  Verbally with patient Anesthesia:    Anesthesia method:  Local infiltration   Local anesthetic:  Lidocaine 1% w/o epi Laceration details:    Location:  Foot   Foot location:  Top of R foot   Length (cm):  2   Depth (mm):  5 Pre-procedure details:    Preparation:  Patient was prepped and draped in usual sterile fashion Exploration:    Limited defect created (wound extended): no     Hemostasis achieved with:  Direct pressure   Contaminated: no   Treatment:    Area cleansed with:  Povidone-iodine and saline  Amount of cleaning:  Standard   Irrigation solution:  Sterile saline   Irrigation volume:  10   Irrigation method:  Syringe   Visualized foreign bodies/material removed: no     Debridement:  None   Undermining:  None   Scar revision: no   Skin repair:    Repair method:  Sutures   Suture size:  4-0   Suture material:  Nylon   Suture technique:  Simple interrupted   Number of sutures:  3 Approximation:    Approximation:  Close Repair type:    Repair type:  Simple Post-procedure details:    Dressing:  Non-adherent dressing and bulky dressing   Procedure completion:  Tolerated well, no immediate complications   MEDICATIONS ORDERED IN ED: Medications  Tdap (BOOSTRIX) injection 0.5 mL (0.5 mLs Intramuscular Given 12/01/21 2002)  lidocaine (PF) (XYLOCAINE) 1 % injection 5 mL (5 mLs Infiltration Given 12/01/21 2200)     IMPRESSION / MDM / ASSESSMENT AND PLAN / ED COURSE  I reviewed the triage vital signs and the nursing notes.                              Differential  diagnosis includes, but is not limited to, foot laceration, foot contusion, foot sprain, foot abrasion, foot fracture  Patient's presentation is most consistent with acute complicated illness / injury requiring diagnostic workup.  Patient to the ED for evaluation of accidental laceration to the right foot.  He excellently dropped a circular saw on his foot, creating a laceration over the dorsal lateral first MTP.  Patient's diagnosis is consistent with foot laceration.  Patient consents to wound repair, sutures were used to approximate the wound edges.  Patient will be discharged home with wound care instructions and supplies. Patient is to follow up with his primary provider for suture removal in 10 to 12 days, as needed or otherwise directed. Patient is given ED precautions to return to the ED for any worsening or new symptoms.     FINAL CLINICAL IMPRESSION(S) / ED DIAGNOSES   Final diagnoses:  Laceration of right foot, initial encounter     Rx / DC Orders   ED Discharge Orders     None        Note:  This document was prepared using Dragon voice recognition software and may include unintentional dictation errors.    Lissa Hoard, PA-C 12/08/21 [place] Scrape, MD 12/10/21 2157
